# Patient Record
Sex: Male | Born: 1980
Health system: Southern US, Community
[De-identification: ages and names within clinical notes are randomized; demographics above are authoritative.]

## PROBLEM LIST (undated history)

## (undated) DIAGNOSIS — G43909 Migraine, unspecified, not intractable, without status migrainosus: Secondary | ICD-10-CM

---

## 2000-01-26 ENCOUNTER — Emergency Department (HOSPITAL_COMMUNITY): Admission: EM | Admit: 2000-01-26 | Discharge: 2000-01-26 | Payer: Self-pay | Admitting: Emergency Medicine

## 2004-08-09 ENCOUNTER — Emergency Department (HOSPITAL_COMMUNITY): Admission: EM | Admit: 2004-08-09 | Discharge: 2004-08-09 | Payer: Self-pay | Admitting: Emergency Medicine

## 2005-12-18 ENCOUNTER — Emergency Department (HOSPITAL_COMMUNITY): Admission: EM | Admit: 2005-12-18 | Discharge: 2005-12-18 | Payer: Self-pay | Admitting: Emergency Medicine

## 2006-09-18 ENCOUNTER — Emergency Department (HOSPITAL_COMMUNITY): Admission: EM | Admit: 2006-09-18 | Discharge: 2006-09-18 | Payer: Self-pay | Admitting: Emergency Medicine

## 2009-07-24 ENCOUNTER — Emergency Department (HOSPITAL_COMMUNITY): Admission: EM | Admit: 2009-07-24 | Discharge: 2009-07-24 | Payer: Self-pay | Admitting: Emergency Medicine

## 2011-04-04 ENCOUNTER — Emergency Department (HOSPITAL_COMMUNITY)
Admission: EM | Admit: 2011-04-04 | Discharge: 2011-04-05 | Disposition: A | Discharge status: Home / Self Care | Payer: No Typology Code available for payment source | Attending: Emergency Medicine | Admitting: Emergency Medicine

## 2011-04-04 DIAGNOSIS — R109 Unspecified abdominal pain: Secondary | ICD-10-CM | POA: Insufficient documentation

## 2011-04-04 DIAGNOSIS — M545 Low back pain, unspecified: Secondary | ICD-10-CM | POA: Insufficient documentation

## 2011-04-04 DIAGNOSIS — M542 Cervicalgia: Secondary | ICD-10-CM | POA: Insufficient documentation

## 2011-04-05 ENCOUNTER — Emergency Department (HOSPITAL_COMMUNITY): Payer: No Typology Code available for payment source

## 2011-04-05 LAB — CBC
HCT: 42.1 % (ref 39.0–52.0)
MCHC: 35.4 g/dL (ref 30.0–36.0)
MCV: 95.5 fL (ref 78.0–100.0)
RDW: 12.3 % (ref 11.5–15.5)
WBC: 11.6 10*3/uL — ABNORMAL HIGH (ref 4.0–10.5)

## 2011-04-05 LAB — DIFFERENTIAL
Lymphocytes Relative: 39 % (ref 12–46)
Lymphs Abs: 4.6 10*3/uL — ABNORMAL HIGH (ref 0.7–4.0)
Monocytes Absolute: 0.7 10*3/uL (ref 0.1–1.0)
Monocytes Relative: 6 % (ref 3–12)
Neutro Abs: 6.2 10*3/uL (ref 1.7–7.7)
Neutrophils Relative %: 53 % (ref 43–77)

## 2011-04-05 LAB — BASIC METABOLIC PANEL
BUN: 17 mg/dL (ref 6–23)
Chloride: 103 mEq/L (ref 96–112)
GFR calc Af Amer: >60 mL/min (ref >60)
GFR calc non Af Amer: >60 mL/min (ref >60)
Glucose, Bld: 87 mg/dL (ref 70–99)

## 2011-04-05 MED ORDER — IOHEXOL 300 MG/ML  SOLN
125.0000 mL | Freq: Once | INTRAMUSCULAR | Status: AC | PRN
  Administered 2011-04-05: 125 mL via INTRAVENOUS

## 2013-06-29 ENCOUNTER — Emergency Department (HOSPITAL_COMMUNITY): Payer: No Typology Code available for payment source

## 2013-06-29 ENCOUNTER — Emergency Department (HOSPITAL_COMMUNITY)
Admission: EM | Admit: 2013-06-29 | Discharge: 2013-06-29 | Disposition: A | Discharge status: Home / Self Care | Payer: No Typology Code available for payment source | Attending: Emergency Medicine | Admitting: Emergency Medicine

## 2013-06-29 ENCOUNTER — Encounter (HOSPITAL_COMMUNITY): Payer: Self-pay | Admitting: Physical Medicine and Rehabilitation

## 2013-06-29 DIAGNOSIS — R079 Chest pain, unspecified: Secondary | ICD-10-CM | POA: Insufficient documentation

## 2013-06-29 DIAGNOSIS — R197 Diarrhea, unspecified: Secondary | ICD-10-CM | POA: Insufficient documentation

## 2013-06-29 DIAGNOSIS — J3489 Other specified disorders of nose and nasal sinuses: Secondary | ICD-10-CM | POA: Insufficient documentation

## 2013-06-29 DIAGNOSIS — J40 Bronchitis, not specified as acute or chronic: Secondary | ICD-10-CM | POA: Insufficient documentation

## 2013-06-29 DIAGNOSIS — F17200 Nicotine dependence, unspecified, uncomplicated: Secondary | ICD-10-CM | POA: Insufficient documentation

## 2013-06-29 LAB — POCT I-STAT TROPONIN I: Troponin i, poc: 0.01 ng/mL (ref 0.00–0.08)

## 2013-06-29 LAB — BASIC METABOLIC PANEL
CO2: 25 mEq/L (ref 19–32)
Calcium: 9.4 mg/dL (ref 8.4–10.5)
Chloride: 102 mEq/L (ref 96–112)
GFR calc non Af Amer: >90 mL/min (ref >90)
Glucose, Bld: 83 mg/dL (ref 70–99)

## 2013-06-29 LAB — CBC WITH DIFFERENTIAL/PLATELET
Basophils Absolute: 0 10*3/uL (ref 0.0–0.1)
HCT: 41.6 % (ref 39.0–52.0)
Hemoglobin: 14.8 g/dL (ref 13.0–17.0)
Lymphocytes Relative: 35 % (ref 12–46)
MCV: 95.6 fL (ref 78.0–100.0)
Neutrophils Relative %: 51 % (ref 43–77)
Platelets: 209 10*3/uL (ref 150–400)
RDW: 12.5 % (ref 11.5–15.5)

## 2013-06-29 MED ORDER — PREDNISONE 10 MG PO TABS: 20.0000 mg | ORAL_TABLET | Freq: Two times a day (BID) | ORAL | Status: DC

## 2013-06-29 MED ORDER — ALBUTEROL SULFATE HFA 108 (90 BASE) MCG/ACT IN AERS
2.0000 | INHALATION_SPRAY | Freq: Four times a day (QID) | RESPIRATORY_TRACT | Status: DC
  Administered 2013-06-29: 2 via RESPIRATORY_TRACT
  Filled 2013-06-29: qty 6.7

## 2013-06-29 NOTE — ED Notes (Signed)
Pt presents to department for evaluation of SOB and L sided chest pain radiating to R am. Onset Tuesday at work. Pt states chest feels tight, 6/10 pain that becomes worse with movement and deep breathing. Respirations unlabored. Speaking complete sentences. Pt is alert and oriented x4.

## 2013-06-29 NOTE — ED Provider Notes (Signed)
Patient with upper respiratory symptoms including cough and shortness of breath with mild chest pain. This has been going on for several days, on my examination the patient has diffuse mild expiratory wheezing, no rales, speaking in full sentences without any increased work of breathing. There is no peripheral edema, normal heart sounds, soft abdomen. The patient has a clear oropharynx, he appears stable for discharge.  Meds given in ED:  Medications  albuterol (PROVENTIL HFA;VENTOLIN HFA) 108 (90 BASE) MCG/ACT inhaler 2 puff (2 puffs Inhalation Given 06/29/13 1831)    Discharge Medication List as of 06/29/2013  6:28 PM    START taking these medications   Details  predniSONE (DELTASONE) 10 MG tablet Take 2 tablets (20 mg total) by mouth 2 (two) times daily., Starting 06/29/2013, Until Discontinued, Print        Medical screening examination/treatment/procedure(s) were conducted as a shared visit with non-physician practitioner(s) and myself.  I personally evaluated the patient during the encounter.  Clinical Impression: Bronchitis, shortness of breath  Vida Roller, MD 06/29/13 2117

## 2013-06-29 NOTE — ED Provider Notes (Signed)
CSN: [id number]     Arrival date & time 06/29/13  1515 History   First MD Initiated Contact with Patient 06/29/13 1747     Chief Complaint  Patient presents with  . Shortness of Breath  . Chest Pain   (Consider location/radiation/quality/duration/timing/severity/associated sxs/prior Treatment) Patient is a 32 y.o. male presenting with shortness of breath and chest pain. The history is provided by the patient. No language interpreter was used.  Shortness of Breath Severity:  Mild Onset quality:  Gradual Timing:  Constant Chronicity:  New Ineffective treatments: tried Tylenol cold and Flu with minimal relief. Associated symptoms: chest pain and cough   Chest pain:    Quality:  Aching   Severity:  Mild   Onset quality:  Gradual   Duration:  1 day   Timing:  Intermittent   Progression:  Unchanged   Chronicity:  New Cough:    Cough characteristics:  Non-productive   Severity:  Mild   Duration:  2 days   Timing:  Intermittent   Progression:  Worsening   Chronicity:  New Risk factors: no family hx of DVT, no hx of PE/DVT and no obesity   Chest Pain Associated symptoms: cough and shortness of breath    Pt is a 32 year old male who presents with c/o shortness of breath and chest pain. He reports that he has recently been sick with diarrhea and nausea approx 4 days ago with associated fever. He reports an increase in shortness of breath and chest congestion since 11am this morning. He reports that it hurts on the left side of his chest and it is difficult to get a good deep breath. He has accompanied nasal congestion and a sore throat when he swallows. He has had a non-productive cough for the last 3-4 days that seems to worsen at night time. He has tried Tylenol cold and flu with minimal reflief.    History reviewed. No pertinent past medical history. History reviewed. No pertinent past surgical history. History reviewed. No pertinent family history. History  Substance Use Topics   . Smoking status: Current Every Day Smoker    Types: Cigarettes  . Smokeless tobacco: Not on file  . Alcohol Use: No    Review of Systems  Constitutional:       Possible fever last Saturday  HENT: Positive for congestion.   Respiratory: Positive for cough and shortness of breath.   Cardiovascular: Positive for chest pain.  Gastrointestinal: Positive for diarrhea.       Friday and Saturday reports of diarrhea  All other systems reviewed and are negative.    Allergies  Review of patient's allergies indicates no known allergies.  Home Medications  No current outpatient prescriptions on file. BP 133/79  Pulse 58  Temp(Src) 97.9 F (36.6 C) (Oral)  Resp 20  SpO2 96% Physical Exam  Vitals reviewed. Constitutional: He is oriented to person, place, and time. He appears well-developed and well-nourished. No distress.  HENT:  Head: Normocephalic and atraumatic.  Right Ear: Tympanic membrane, external ear and ear canal normal.  Left Ear: Tympanic membrane, external ear and ear canal normal.  Nose: Sinus tenderness present.  Mouth/Throat: Posterior oropharyngeal erythema present. No oropharyngeal exudate.  Tonsils grade II, mild erythema. Nasal tenderness bilaterally.  Eyes: Pupils are equal, round, and reactive to light.  Neck: Normal range of motion.  Pulmonary/Chest: Effort normal. He has wheezes.  Mild inspiratory wheeze, RMF.  Abdominal: Soft. Bowel sounds are normal.  Musculoskeletal: Normal range of motion.  Lymphadenopathy:    He has no cervical adenopathy.  Neurological: He is alert and oriented to person, place, and time.  Skin: Skin is warm and dry.  Psychiatric: He has a normal mood and affect. His behavior is normal. Judgment and thought content normal.    ED Course  Procedures (including critical care time) Labs Review Labs Reviewed  CBC WITH DIFFERENTIAL - Abnormal; Notable for the following:    Eosinophils Relative 7 (*)    All other components within  normal limits  BASIC METABOLIC PANEL  POCT I-STAT TROPONIN I   Imaging Review Dg Chest 2 View  06/29/2013   CLINICAL DATA:  32 year old male with shortness of breath and left side chest pain.  EXAM: CHEST  2 VIEW  COMPARISON:  07/24/2009.  FINDINGS: Lung volumes are stable and normal. Visualized tracheal air column is within normal limits. No pleural effusion. No acute osseous abnormality identified. Normal cardiac size and mediastinal contours. No pulmonary edema, pneumothorax or confluent pulmonary opacity.  IMPRESSION: No acute cardiopulmonary abnormality.   Electronically Signed   By: Augusto Gamble M.D.   On: 06/29/2013 16:04    MDM   1. Bronchitis    Pt afebrile, vital signs stable. No dyspnea or tachycardia. History of viral illness with nausea and diarrhea 3-4 days ago. Non-productive cough and congestion with gradual increase in shortness of breath. Chest x-ray unremarkable, troponin negative, WBC's normal. No history of cardiac disease, DVT or PE. Mild wheezing cleared after albuterol MDI.      Irish Elders, NP 06/29/13 [phone]

## 2015-02-20 ENCOUNTER — Ambulatory Visit (INDEPENDENT_AMBULATORY_CARE_PROVIDER_SITE_OTHER): Payer: BLUE CROSS/BLUE SHIELD | Admitting: Family Medicine

## 2015-02-20 VITALS — BP 118/76 | HR 80 | Temp 97.8°F | Resp 16 | Ht 65.0 in | Wt 179.8 lb

## 2015-02-20 DIAGNOSIS — M79601 Pain in right arm: Secondary | ICD-10-CM | POA: Diagnosis not present

## 2015-02-20 DIAGNOSIS — L02411 Cutaneous abscess of right axilla: Secondary | ICD-10-CM | POA: Diagnosis not present

## 2015-02-20 DIAGNOSIS — M79621 Pain in right upper arm: Secondary | ICD-10-CM

## 2015-02-20 MED ORDER — DOXYCYCLINE HYCLATE 100 MG PO CAPS: 100.0000 mg | ORAL_CAPSULE | Freq: Two times a day (BID) | ORAL | Status: DC

## 2015-02-20 NOTE — Patient Instructions (Signed)
Use warm compresses on the area on your right axilla.   Keep it covered today- tomorrow you can shower and then re- cover the area Use the antibiotic twice a day as directed Let me know if you are not improving over the next few days Also let me know if you would like to see a dermatologist about this condition  Hidradenitis Suppurativa, Sweat Gland Abscess Hidradenitis suppurativa is a long lasting (chronic), uncommon disease of the sweat glands. With this, boil-like lumps and scarring develop in the groin, some times under the arms (axillae), and under the breasts. It may also uncommonly occur behind the ears, in the crease of the buttocks, and around the genitals.  CAUSES  The cause is from a blocking of the sweat glands. They then become infected. It may cause drainage and odor. It is not contagious. So it cannot be given to someone else. It most often shows up in puberty (about 810 to 34 years of age). But it may happen much later. It is similar to acne which is a disease of the sweat glands. This condition is slightly more common in African-Americans and women. SYMPTOMS   Hidradenitis usually starts as one or more red, tender, swellings in the groin or under the arms (axilla).  Over a period of hours to days the lesions get larger. They often open to the skin surface, draining clear to yellow-colored fluid.  The infected area heals with scarring. DIAGNOSIS  Your caregiver makes this diagnosis by looking at you. Sometimes cultures (growing germs on plates in the lab) may be taken. This is to see what germ (bacterium) is causing the infection.  TREATMENT   Topical germ killing medicine applied to the skin (antibiotics) are the treatment of choice. Antibiotics taken by mouth (systemic) are sometimes needed when the condition is getting worse or is severe.  Avoid tight-fitting clothing which traps moisture in.  Dirt does not cause hidradenitis and it is not caused by poor  hygiene.  Involved areas should be cleaned daily using an antibacterial soap. Some patients find that the liquid form of Lever 2000, applied to the involved areas as a lotion after bathing, can help reduce the odor related to this condition.  Sometimes surgery is needed to drain infected areas or remove scarred tissue. Removal of large amounts of tissue is used only in severe cases.  Birth control pills may be helpful.  Oral retinoids (vitamin A derivatives) for 6 to 12 months which are effective for acne may also help this condition.  Weight loss will improve but not cure hidradenitis. It is made worse by being overweight. But the condition is not caused by being overweight.  This condition is more common in people who have had acne.  It may become worse under stress. There is no medical cure for hidradenitis. It can be controlled, but not cured. The condition usually continues for years with periods of getting worse and getting better (remission). Document Released: 05/06/2004 Document Revised: 12/15/2011 Document Reviewed: 12/23/2013 [facility] Patient Information 2015 [employer] [place]. This information is not intended to replace advice given to you by your health care provider. Make sure you discuss any questions you have with your health care provider.

## 2015-02-20 NOTE — Progress Notes (Signed)
[facility] [address], [place] [address] [phone]  Date:  02/20/2015   Name:  Donald BalesRobert Brown   DOB:  05/09/1981   MRN:  [id number]  PCP:  No PCP Per Patient    Chief Complaint: Flank Pain and other   History of Present Illness:  Donald Brown is a 34 y.o. very pleasant male patient who presents with the following:  He has noted a boil under ihs right arm for a couple of days.  He tends to get these under his arms and in his gluteal cleft- he may get these 2-3x a month, but does not usually need to come to MD to have them drained.  He notes some pain in his right flank but thinks this is related to the boil. He has not noted a fever He is generally in good health He is a smoker NKDA  There are no active problems to display for this patient.   History reviewed. No pertinent past medical history.  History reviewed. No pertinent past surgical history.  History  Substance Use Topics  . Smoking status: Current Every Day Smoker    Types: Cigarettes  . Smokeless tobacco: Not on file  . Alcohol Use: No    Family History  Problem Relation Age of Onset  . Diabetes Mother   . Hypertension Mother     No Known Allergies  Medication list has been reviewed and updated.  Current Outpatient Prescriptions on File Prior to Visit  Medication Sig Dispense Refill  . predniSONE (DELTASONE) 10 MG tablet Take 2 tablets (20 mg total) by mouth 2 (two) times daily. (Patient not taking: Reported on 02/20/2015) 10 tablet 0   No current facility-administered medications on file prior to visit.    Review of Systems:  As per HPI- otherwise negative.   Physical Examination: Filed Vitals:   02/20/15 1435  BP: 118/76  Pulse: 80  Temp: 97.8 F (36.6 C)  Resp: 16   Filed Vitals:   02/20/15 1435  Height: 5\' 5"  (1.651 m)  Weight: 179 lb 12.8 oz (81.557 kg)   Body mass index is 29.92 kg/(m^2). Ideal Body Weight: Weight in (lb) to have BMI = 25: 149.9  GEN:  WDWN, NAD, Non-toxic, A & O x 3, looks well, muscular build but he does not appear to be overweight HEENT: Atraumatic, Normocephalic. Neck supple. No masses, No LAD. Ears and Nose: No external deformity. CV: RRR, No M/G/R. No JVD. No thrill. No extra heart sounds. PULM: CTA B, no wheezes, crackles, rhonchi. No retractions. No resp. distress. No accessory muscle use. ABD: S, NT, ND EXTR: No c/c/e NEURO Normal gait.  PSYCH: Normally interactive. Conversant. Not depressed or anxious appearing.  Calm demeanor.  He has a boil in the right axilla, somewhat inferior in position VC obtained.  Area prepped with betadine and alcohol.  Anesthesia with 1ml of 1% lido.  I and D with 11 blade, being careful not to penetrate further than dermis of the chest.  Pus expressed.   Wound dressed   He notes that flank pain is resolved once I and D complete  Assessment and Plan: Abscess of right axilla - Plan: doxycycline (VIBRAMYCIN) 100 MG capsule  Pain in right axilla  Treated with I and D as above. Doxycycline, warm compresses Discussed referral for his likely hidradenitis but he declines at this time See patient instructions for more details.     Signed Abbe AmsterdamJessica Copland, MD

## 2015-10-04 ENCOUNTER — Encounter (HOSPITAL_COMMUNITY): Payer: Self-pay | Admitting: Emergency Medicine

## 2015-10-04 ENCOUNTER — Emergency Department (HOSPITAL_COMMUNITY)
Admission: EM | Admit: 2015-10-04 | Discharge: 2015-10-04 | Disposition: A | Discharge status: Home / Self Care | Payer: BLUE CROSS/BLUE SHIELD | Source: Home / Self Care

## 2015-10-04 DIAGNOSIS — J111 Influenza due to unidentified influenza virus with other respiratory manifestations: Secondary | ICD-10-CM

## 2015-10-04 NOTE — ED Notes (Signed)
[facility] a [facility], fever and general body aches x 2 days. The patient stated that he has tried OTC meds with minimal relief.

## 2015-10-04 NOTE — ED Provider Notes (Signed)
CSN: [id number]     Arrival date & time 10/04/15  1811 History   None    Chief Complaint  Patient presents with  . Fever  . Cough  . Generalized Body Aches   (Consider location/radiation/quality/duration/timing/severity/associated sxs/prior Treatment) HPI History obtained from patient:  Fever body aches, cough headache started yesterday. Tylenol for fever at home Ibuprofen 400 mg at 1 pm Concerned that he may have pneumonia History reviewed. No pertinent past medical history. History reviewed. No pertinent past surgical history. Family History  Problem Relation Age of Onset  . Diabetes Mother   . Hypertension Mother    Social History  Substance Use Topics  . Smoking status: Current Every Day Smoker    Types: Cigarettes  . Smokeless tobacco: None  . Alcohol Use: No    Review of Systems Shatz +'ve body aches  Denies: HEADACHE, NAUSEA, ABDOMINAL PAIN, CHEST PAIN, CONGESTION, DYSURIA, SHORTNESS OF BREATH  Allergies  Review of patient's allergies indicates no known allergies.  Home Medications   Prior to Admission medications   Medication Sig Start Date End Date Taking? Authorizing Provider  doxycycline (VIBRAMYCIN) 100 MG capsule Take 1 capsule (100 mg total) by mouth 2 (two) times daily. 02/20/15   Pearline CablesJessica C Copland, MD   Meds Ordered and Administered this Visit  Medications - No data to display  BP 120/77 mmHg  Pulse 112  Temp(Src) 99.7 F (37.6 C) (Oral)  Resp 20  SpO2 95% No data found.   Physical Exam NURSES NOTES AND VITAL SIGNS REVIEWED. CONSTITUTIONAL: Well developed, well nourished, no acute distress HEENT: normocephalic, atraumatic EYES: Conjunctiva normal NECK:normal ROM, supple PULMONARY:No respiratory distress, normal effort, Lungs: CTAb/l CARDIOVASCULAR: RRR, no murmur ABDOMEN: soft, ND, NT, +'ve BS MUSCULOSKELETAL: Normal ROM of all extremities SKIN: warm and dry without rash PSYCHIATRIC: Mood and affect normal  ED Course  Procedures  (including critical care time)  Labs Review Labs Reviewed - No data to display  Imaging Review No results found.   Visual Acuity Review  Right Eye Distance:   Left Eye Distance:   Bilateral Distance:    Right Eye Near:   Left Eye Near:    Bilateral Near:         MDM   1. Flu syndrome    Discussed use of Tamiflu. Pt feels that he does not wish to have this medication. Will continue to treat symptoms.     Tharon AquasFrank C Naseem Varden, PA 10/04/15 2003

## 2015-10-04 NOTE — Discharge Instructions (Signed)

## 2017-06-02 ENCOUNTER — Emergency Department (HOSPITAL_COMMUNITY)
Admission: EM | Admit: 2017-06-02 | Discharge: 2017-06-02 | Disposition: A | Discharge status: Home / Self Care | Payer: BLUE CROSS/BLUE SHIELD | Attending: Emergency Medicine | Admitting: Emergency Medicine

## 2017-06-02 ENCOUNTER — Emergency Department (HOSPITAL_COMMUNITY): Payer: BLUE CROSS/BLUE SHIELD

## 2017-06-02 ENCOUNTER — Encounter (HOSPITAL_COMMUNITY): Payer: Self-pay | Admitting: Emergency Medicine

## 2017-06-02 DIAGNOSIS — F17210 Nicotine dependence, cigarettes, uncomplicated: Secondary | ICD-10-CM | POA: Insufficient documentation

## 2017-06-02 DIAGNOSIS — G43909 Migraine, unspecified, not intractable, without status migrainosus: Secondary | ICD-10-CM

## 2017-06-02 DIAGNOSIS — R51 Headache: Secondary | ICD-10-CM | POA: Diagnosis not present

## 2017-06-02 DIAGNOSIS — G43009 Migraine without aura, not intractable, without status migrainosus: Secondary | ICD-10-CM | POA: Diagnosis not present

## 2017-06-02 HISTORY — DX: Migraine, unspecified, not intractable, without status migrainosus: G43.909

## 2017-06-02 MED ORDER — METOCLOPRAMIDE HCL 10 MG PO TABS
5.0000 mg | ORAL_TABLET | Freq: Once | ORAL | Status: AC
  Administered 2017-06-02: 5 mg via ORAL
  Filled 2017-06-02: qty 1

## 2017-06-02 MED ORDER — KETOROLAC TROMETHAMINE 30 MG/ML IJ SOLN
30.0000 mg | Freq: Once | INTRAMUSCULAR | Status: AC
  Administered 2017-06-02: 30 mg via INTRAMUSCULAR
  Filled 2017-06-02: qty 1

## 2017-06-02 MED ORDER — DIPHENHYDRAMINE HCL 25 MG PO CAPS
25.0000 mg | ORAL_CAPSULE | Freq: Once | ORAL | Status: AC
  Administered 2017-06-02: 25 mg via ORAL
  Filled 2017-06-02: qty 1

## 2017-06-02 NOTE — ED Notes (Signed)
No hx migraines, pt is photophobic, nauseated.

## 2017-06-02 NOTE — ED Provider Notes (Signed)
[facility]PT Provider Note   CSN: [id number] Arrival date & time: 06/02/17  1537     History   Chief Complaint Chief Complaint  Patient presents with  . Migraine    HPI Donald Brown is a 36 y.o. male.  HPI   36 year old male presents today with complaints of headache.  Patient notes that over the last several months he has had intermittent head pressure diffusely.  He notes the symptoms come without any known aggravating factor.  The last anywhere from hours to days and are improved with rest, sometimes improved with Tylenol.  He notes no associated dizziness, nausea or vomiting.  He reports photophobia, no phonophobia.  Patient reports that when the symptoms start he often times has numbness down his right upper extremity which resolves with resolution of headache.  Patient denies any other neurological deficits.  Patient reports most recent episode started approximately 1 day ago and has persisted, no neuro deficits with this episode.  He denies any previous history of headache prior to these episodes.  Patient does report that he wears glasses.     Past Medical History:  Diagnosis Date  . Migraines     There are no active problems to display for this patient.   History reviewed. No pertinent surgical history.     Home Medications    Prior to Admission medications   Medication Sig Start Date End Date Taking? Authorizing Provider  doxycycline (VIBRAMYCIN) 100 MG capsule Take 1 capsule (100 mg total) by mouth 2 (two) times daily. 02/20/15   Copland, Gwenlyn Found, MD    Family History Family History  Problem Relation Age of Onset  . Diabetes Mother   . Hypertension Mother     Social History Social History  Substance Use Topics  . Smoking status: Current Every Day Smoker    Types: Cigarettes  . Smokeless tobacco: Never Used  . Alcohol use No     Allergies   Patient has no known allergies.   Review of Systems Review of Systems  All other systems  reviewed and are negative.    Physical Exam Updated Vital Signs BP 130/82   Pulse 62   Temp 98.4 F (36.9 C) (Oral)   Resp 16   Ht 5\' 6"  (1.676 m)   Wt 81.6 kg (180 lb)   SpO2 99%   BMI 29.05 kg/m   Physical Exam  Constitutional: He is oriented to person, place, and time. He appears well-developed and well-nourished. No distress.  HENT:  Head: Normocephalic and atraumatic.  Eyes: Pupils are equal, round, and reactive to light. Conjunctivae and EOM are normal. Right eye exhibits no discharge. Left eye exhibits no discharge. No scleral icterus.  Neck: Normal range of motion. Neck supple. No JVD present. No tracheal deviation present.  Pulmonary/Chest: Effort normal. No stridor.  Musculoskeletal: Normal range of motion. He exhibits no edema or tenderness.  Lymphadenopathy:    He has no cervical adenopathy.  Neurological: He is alert and oriented to person, place, and time. He has normal strength. He displays no atrophy and no tremor. No cranial nerve deficit or sensory deficit. He exhibits normal muscle tone. He displays a negative Romberg sign. He displays no seizure activity. Coordination and gait normal. GCS eye subscore is 4. GCS verbal subscore is 5. GCS motor subscore is 6.  Reflex Scores:      Patellar reflexes are 2+ on the right side and 2+ on the left side. Skin: He is not diaphoretic.  Psychiatric: He  has a normal mood and affect. His behavior is normal. Judgment and thought content normal.  Nursing note and vitals reviewed.    ED Treatments / Results  Labs (all labs ordered are listed, but only abnormal results are displayed) Labs Reviewed - No data to display  EKG  EKG Interpretation None       Radiology Ct Head Wo Contrast  Result Date: 06/02/2017 CLINICAL DATA:  Headache with photophobia and blurred vision EXAM: CT HEAD WITHOUT CONTRAST TECHNIQUE: Contiguous axial images were obtained from the base of the skull through the vertex without intravenous  contrast. COMPARISON:  None. FINDINGS: Brain: No evidence of acute infarction, hemorrhage, hydrocephalus, extra-axial collection or mass lesion/mass effect. Vascular: No hyperdense vessel or unexpected calcification. Skull: Normal. Negative for fracture or focal lesion. Sinuses/Orbits: Mild mucosal thickening in the ethmoid and sphenoid sinuses. No acute orbital abnormality. Other: None IMPRESSION: No CT evidence for acute intracranial abnormality. Electronically Signed   By: Jasmine Pang M.D.   On: 06/02/2017 21:07    Procedures Procedures (including critical care time)  Medications Ordered in ED Medications  ketorolac (TORADOL) 30 MG/ML injection 30 mg (30 mg Intramuscular Given 06/02/17 2016)  metoCLOPramide (REGLAN) tablet 5 mg (5 mg Oral Given 06/02/17 2015)  diphenhydrAMINE (BENADRYL) capsule 25 mg (25 mg Oral Given 06/02/17 2015)     Initial Impression / Assessment and Plan / ED Course  I have reviewed the triage vital signs and the nursing notes.  Pertinent labs & imaging results that were available during my care of the patient were reviewed by me and considered in my medical decision making (see chart for details).      Final Clinical Impressions(s) / ED Diagnoses   Final diagnoses:  Migraine without status migrainosus, not intractable, unspecified migraine type    36 year old male presents today with migraine.  Symptoms improved with the above medications.  CT scan shows no acute findings.  Patient will follow-up as an outpatient with neurology if symptoms persist, return precautions given.  He verbalized understanding and agreement to today's plan.  New Prescriptions New Prescriptions   No medications on file     Rosalio Loud 06/02/17 2133    Pricilla Loveless, MD 06/03/17 [phone]

## 2017-06-02 NOTE — Discharge Instructions (Signed)
Please read attached information. If you experience any new or worsening signs or symptoms please return to the emergency room for evaluation. Please follow-up with your primary care provider or specialist as discussed.  °

## 2017-06-02 NOTE — ED Triage Notes (Signed)
Pt states he gets  2-3 migraines a month; sometimes one a week. Experiencing a migraine today, took 2 tylenol with some relief. Pt states light bothers his eyes. Pt states he has been nauseated. Neuro intact.

## 2017-06-02 NOTE — ED Notes (Signed)
Pt. Complains of a migraine that started last night. Has taken tylenol and it helped a little. Can cause right arm numbness and blurry vision. Pt. Reports nausea but denies emesis.

## 2017-07-09 DIAGNOSIS — R61 Generalized hyperhidrosis: Secondary | ICD-10-CM | POA: Diagnosis not present

## 2017-07-09 DIAGNOSIS — M79641 Pain in right hand: Secondary | ICD-10-CM | POA: Diagnosis not present

## 2017-07-09 DIAGNOSIS — R718 Other abnormality of red blood cells: Secondary | ICD-10-CM | POA: Diagnosis not present

## 2017-07-09 DIAGNOSIS — Z23 Encounter for immunization: Secondary | ICD-10-CM | POA: Diagnosis not present

## 2017-07-09 DIAGNOSIS — Z114 Encounter for screening for human immunodeficiency virus [HIV]: Secondary | ICD-10-CM | POA: Diagnosis not present

## 2017-07-09 DIAGNOSIS — Z0000 Encounter for general adult medical examination without abnormal findings: Secondary | ICD-10-CM | POA: Diagnosis not present

## 2017-07-09 DIAGNOSIS — M79642 Pain in left hand: Secondary | ICD-10-CM | POA: Diagnosis not present

## 2017-07-09 DIAGNOSIS — Z13220 Encounter for screening for lipoid disorders: Secondary | ICD-10-CM | POA: Diagnosis not present

## 2018-08-23 DIAGNOSIS — M7521 Bicipital tendinitis, right shoulder: Secondary | ICD-10-CM | POA: Diagnosis not present

## 2018-09-08 DIAGNOSIS — M25511 Pain in right shoulder: Secondary | ICD-10-CM | POA: Diagnosis not present

## 2019-04-06 ENCOUNTER — Other Ambulatory Visit: Payer: Self-pay

## 2019-04-06 ENCOUNTER — Encounter (HOSPITAL_COMMUNITY): Payer: Self-pay | Admitting: Emergency Medicine

## 2019-04-06 ENCOUNTER — Ambulatory Visit (INDEPENDENT_AMBULATORY_CARE_PROVIDER_SITE_OTHER): Payer: BC Managed Care – PPO

## 2019-04-06 ENCOUNTER — Ambulatory Visit (HOSPITAL_COMMUNITY)
Admission: EM | Admit: 2019-04-06 | Discharge: 2019-04-06 | Disposition: A | Discharge status: Home / Self Care | Payer: BC Managed Care – PPO | Attending: Urgent Care | Admitting: Urgent Care

## 2019-04-06 DIAGNOSIS — J410 Simple chronic bronchitis: Secondary | ICD-10-CM | POA: Diagnosis not present

## 2019-04-06 DIAGNOSIS — Z720 Tobacco use: Secondary | ICD-10-CM

## 2019-04-06 DIAGNOSIS — R079 Chest pain, unspecified: Secondary | ICD-10-CM | POA: Diagnosis not present

## 2019-04-06 DIAGNOSIS — R05 Cough: Secondary | ICD-10-CM

## 2019-04-06 DIAGNOSIS — R042 Hemoptysis: Secondary | ICD-10-CM

## 2019-04-06 DIAGNOSIS — R42 Dizziness and giddiness: Secondary | ICD-10-CM

## 2019-04-06 DIAGNOSIS — K644 Residual hemorrhoidal skin tags: Secondary | ICD-10-CM

## 2019-04-06 DIAGNOSIS — R519 Headache, unspecified: Secondary | ICD-10-CM

## 2019-04-06 DIAGNOSIS — R053 Chronic cough: Secondary | ICD-10-CM

## 2019-04-06 DIAGNOSIS — R51 Headache: Secondary | ICD-10-CM | POA: Diagnosis not present

## 2019-04-06 DIAGNOSIS — M2550 Pain in unspecified joint: Secondary | ICD-10-CM

## 2019-04-06 MED ORDER — AZITHROMYCIN 250 MG PO TABS: ORAL_TABLET | ORAL | 0 refills | Status: DC

## 2019-04-06 MED ORDER — HYDROCORTISONE 1 % EX CREA: TOPICAL_CREAM | CUTANEOUS | 0 refills | Status: DC

## 2019-04-06 MED ORDER — ALBUTEROL SULFATE HFA 108 (90 BASE) MCG/ACT IN AERS: 1.0000 | INHALATION_SPRAY | Freq: Four times a day (QID) | RESPIRATORY_TRACT | 0 refills | Status: DC | PRN

## 2019-04-06 NOTE — ED Triage Notes (Signed)
PT reports the following symptoms have been intermittent for the past 2 years: cough, headache, dizziness, rectal bleeding, back pain, R shoulder pain, R leg numbness.  Only new symptom is PT noticed some blood in sputum this AM

## 2019-04-06 NOTE — ED Provider Notes (Signed)
MRN: [id number] DOB: 06-26-1981  Subjective:   Donald Brown is a 38 y.o. male presenting for 2-year history of chronic productive cough and had blood in his sputum this morning that concerned him. Smokes ~1ppd, has ~20 pack year history. Has a history of migraines and uses Tylenol successfully for management of this.  Patient also reports multiple intermittent symptoms including dizziness, mild intermittent low back pain shoulder pain, tingling of his legs.  He also reports that he has blood in his stools and when he wipes.  Reports that he eats a mix of foods and tries to get fiber.  He also does a lot of heavy lifting at work.   No Known Allergies   Past Medical History:  Diagnosis Date  . Migraines      History reviewed. No pertinent surgical history.   Review of Systems  Constitutional: Negative for chills, fever and weight loss.  HENT: Negative for ear pain, sinus pain and sore throat.   Eyes: Negative for blurred vision and double vision.  Respiratory: Positive for cough, hemoptysis and shortness of breath.   Cardiovascular: Negative for chest pain and palpitations.  Gastrointestinal: Positive for blood in stool. Negative for abdominal pain, nausea and vomiting.  Genitourinary: Negative for dysuria and hematuria.  Musculoskeletal: Positive for back pain, joint pain and myalgias.  Skin: Negative for rash.  Neurological: Negative for speech change and weakness.  Psychiatric/Behavioral: Negative for substance abuse.    Objective:   Vitals: BP 139/84   Pulse 64   Temp 98.5 F (36.9 C) (Oral)   Resp 16   SpO2 99%   Physical Exam Constitutional:      General: He is not in acute distress.    Appearance: Normal appearance. He is well-developed. He is not ill-appearing, toxic-appearing or diaphoretic.  HENT:     Head: Normocephalic and atraumatic.     Right Ear: External ear normal.     Left Ear: External ear normal.     Nose: Nose normal.     Mouth/Throat:     Mouth:  Mucous membranes are moist.     Pharynx: Oropharynx is clear.  Eyes:     General: No scleral icterus.    Extraocular Movements: Extraocular movements intact.     Pupils: Pupils are equal, round, and reactive to light.  Cardiovascular:     Rate and Rhythm: Normal rate and regular rhythm.     Heart sounds: Normal heart sounds. No murmur. No friction rub. No gallop.   Pulmonary:     Effort: Pulmonary effort is normal. No respiratory distress.     Breath sounds: No stridor. No wheezing, rhonchi or rales.     Comments: Slightly diminished breath sounds throughout. Genitourinary:    Rectum: External hemorrhoid (Notable nonthrombosed external hemorrhoids over areas depicted, all approximately 1 cm in size) present.    Neurological:     Mental Status: He is alert and oriented to person, place, and time.  Psychiatric:        Mood and Affect: Mood normal.        Behavior: Behavior normal.        Thought Content: Thought content normal.    Dg Chest 2 View  Result Date: 04/06/2019 CLINICAL DATA:  Chronic cough. Apophysis. Chest pain, shortness of breath, and wheezing. Smoker. EXAM: CHEST - 2 VIEW COMPARISON:  06/29/2013 FINDINGS: The cardiomediastinal silhouette is unchanged with normal heart size. No airspace consolidation, edema, pleural effusion, or pneumothorax is identified. There is mild peribronchial thickening. No  acute osseous abnormality is seen. IMPRESSION: Mild bronchitic changes. Electronically Signed   By: Sebastian AcheAllen  Grady M.D.   On: 04/06/2019 11:35    Assessment and Plan :   1. Simple chronic bronchitis (HCC)   2. Chronic cough   3. Hemoptysis   4. Frequent headaches   5. External hemorrhoids   6. Multiple joint pain   7. Dizziness   8. Tobacco abuse    We will have patient start azithromycin to address acute on chronic bronchitis.  Use albuterol inhaler, recommended smoking cessation.  Use hydrocortisone rectal for hemorrhoids.  Patient is otherwise stable, placed into a work  you for PCP assistance. Counseled patient on potential for adverse effects with medications prescribed/recommended today, ER and return-to-clinic precautions discussed, patient verbalized understanding.    Wallis BambergMani, Lillith Mcneff, [place] JerseyPA-C 04/06/19 [id number]

## 2019-06-24 DIAGNOSIS — Z20828 Contact with and (suspected) exposure to other viral communicable diseases: Secondary | ICD-10-CM | POA: Diagnosis not present

## 2019-08-03 ENCOUNTER — Ambulatory Visit (HOSPITAL_COMMUNITY)
Admission: EM | Admit: 2019-08-03 | Discharge: 2019-08-03 | Disposition: A | Discharge status: Home / Self Care | Payer: BC Managed Care – PPO | Attending: Family Medicine | Admitting: Family Medicine

## 2019-08-03 ENCOUNTER — Other Ambulatory Visit: Payer: Self-pay

## 2019-08-03 ENCOUNTER — Encounter (HOSPITAL_COMMUNITY): Payer: Self-pay

## 2019-08-03 DIAGNOSIS — N451 Epididymitis: Secondary | ICD-10-CM

## 2019-08-03 MED ORDER — LEVOFLOXACIN 500 MG PO TABS: 500.0000 mg | ORAL_TABLET | Freq: Every day | ORAL | 0 refills | Status: DC

## 2019-08-03 NOTE — Discharge Instructions (Signed)
You may use over the counter ibuprofen or acetaminophen as needed.  ° °

## 2019-08-03 NOTE — ED Provider Notes (Signed)
[facility]   [id number] 08/03/19 Arrival Time: 7846  ASSESSMENT & PLAN:  1. Epididymitis, right     Benign abdominal exam. Given duration of symptoms and with current exam, very low suspicion for testicular torsion. Discussed. Declines STI testing at this time.  Begin: Meds ordered this encounter  Medications  . levofloxacin (LEVAQUIN) 500 MG tablet    Sig: Take 1 tablet (500 mg total) by mouth daily.    Dispense:  10 tablet    Refill:  0     Discharge Instructions     You may use over the counter ibuprofen or acetaminophen as needed.     Follow-up Information    [facility].   Why: If worsening or failing to improve as anticipated. Contact information: [address] [place] [address] [place] [address] [phone]       [facility].   Specialty: Urgent Care Why: As needed. Contact information: [address] [place] [address] [phone]         Work note provided.  Reviewed expectations re: course of current medical issues. Questions answered. Outlined signs and symptoms indicating need for more acute intervention. Patient verbalized understanding. After Visit Summary given.   SUBJECTIVE: History from: patient. Donald Brown is a 38 y.o. male who presents with complaint of intermittent lower abdominal discomfort that radiates to testicle; noticed about 1.5-2 weeks ago; very mild at onset; now much more noticeable. More in testicle now. Describes as "an ache". No scrotal swelling noted. No injury/trauma to scrotum. Last sexual activity with single male partner greater than one month ago. Denies dysuria or urinary frequency. No penile discharge. No GU skin changes. Pain worse with prolonged standing. Fever: absent. He denies arthralgias, belching, chills, constipation, diarrhea, myalgias, nausea, sweats and vomiting. Appetite: normal. PO intake: normal. Ambulatory without  assistance. Bowel movements: have not significantly changed; last bowel movement 1-2 d ago and without blood. History of similar: no. OTC treatment: Tylenol with some relief.   History reviewed. No pertinent surgical history.  [place]amp: As per HPI. All other systems negative.  OBJECTIVE:  Vitals:   08/03/19 1001 08/03/19 1003  BP:  136/79  Pulse:  86  Resp:  (!) 86  Temp:  97.8 F (36.6 C)  TempSrc:  Oral  SpO2:  99%  Weight: 78.5 kg     General appearance: alert, oriented, no acute distress HEENT: [place]; AT; oropharynx moist Lungs: clear to auscultation bilaterally; unlabored respirations Heart: regular rate and rhythm Abdomen: soft; without distention; no specific tenderness to palpation; normal bowel sounds; without masses or organomegaly; without guarding or rebound tenderness GU: normal appearing genitalia; no rashes/lesions; reports significant tenderness over R epididymis Back: without CVA tenderness; FROM at waist Extremities: without LE edema; symmetrical; without gross deformities Skin: warm and dry Neurologic: normal gait Psychological: alert and cooperative; normal mood and affect   No Known Allergies                                             Past Medical History:  Diagnosis Date  . Migraines    Social History   Socioeconomic History  . Marital status: Married    Spouse name: Not on file  . Number of children: Not on file  . Years of education: Not on file  . Highest education level: Not on  file  Occupational History  . Not on file  Social Needs  . Financial resource strain: Not on file  . Food insecurity    Worry: Not on file    Inability: Not on file  . Transportation needs    Medical: Not on file    Non-medical: Not on file  Tobacco Use  . Smoking status: Current Every Day Smoker    Types: Cigarettes  . Smokeless tobacco: Never Used  Substance and Sexual Activity  . Alcohol use: No  . Drug use: No  . Sexual activity: Not on file  Lifestyle   . Physical activity    Days per week: Not on file    Minutes per session: Not on file  . Stress: Not on file  Relationships  . Social Herbalist on phone: Not on file    Gets together: Not on file    Attends religious service: Not on file    Active member of club or organization: Not on file    Attends meetings of clubs or organizations: Not on file    Relationship status: Not on file  . Intimate partner violence    Fear of current or ex partner: Not on file    Emotionally abused: Not on file    Physically abused: Not on file    Forced sexual activity: Not on file  Other Topics Concern  . Not on file  Social History Narrative  . Not on file   Family History  Problem Relation Age of Onset  . Diabetes Mother   . Hypertension Mother      Vanessa Kick, MD 08/03/19 1025

## 2019-08-03 NOTE — ED Triage Notes (Signed)
Pt states he has had abdominal pain and it's radiating down to his testicles. This has been going on for 2 week.

## 2019-08-16 ENCOUNTER — Emergency Department (HOSPITAL_COMMUNITY): Payer: BC Managed Care – PPO

## 2019-08-16 ENCOUNTER — Other Ambulatory Visit: Payer: Self-pay

## 2019-08-16 ENCOUNTER — Encounter (HOSPITAL_COMMUNITY): Payer: Self-pay

## 2019-08-16 ENCOUNTER — Emergency Department (HOSPITAL_COMMUNITY)
Admission: EM | Admit: 2019-08-16 | Discharge: 2019-08-16 | Disposition: A | Discharge status: Home / Self Care | Payer: BC Managed Care – PPO | Attending: Emergency Medicine | Admitting: Emergency Medicine

## 2019-08-16 DIAGNOSIS — F17210 Nicotine dependence, cigarettes, uncomplicated: Secondary | ICD-10-CM | POA: Diagnosis not present

## 2019-08-16 DIAGNOSIS — R1032 Left lower quadrant pain: Secondary | ICD-10-CM | POA: Insufficient documentation

## 2019-08-16 DIAGNOSIS — M545 Low back pain: Secondary | ICD-10-CM | POA: Diagnosis not present

## 2019-08-16 DIAGNOSIS — N451 Epididymitis: Secondary | ICD-10-CM

## 2019-08-16 DIAGNOSIS — R200 Anesthesia of skin: Secondary | ICD-10-CM | POA: Insufficient documentation

## 2019-08-16 DIAGNOSIS — N50812 Left testicular pain: Secondary | ICD-10-CM | POA: Diagnosis present

## 2019-08-16 DIAGNOSIS — R109 Unspecified abdominal pain: Secondary | ICD-10-CM

## 2019-08-16 LAB — URINALYSIS, ROUTINE W REFLEX MICROSCOPIC
Bilirubin Urine: NEGATIVE
Glucose, UA: NEGATIVE mg/dL
Hgb urine dipstick: NEGATIVE
Ketones, ur: NEGATIVE mg/dL
Leukocytes,Ua: NEGATIVE
Nitrite: NEGATIVE
Protein, ur: NEGATIVE mg/dL
Specific Gravity, Urine: 1.013 (ref 1.005–1.030)
pH: 5 (ref 5.0–8.0)

## 2019-08-16 LAB — COMPREHENSIVE METABOLIC PANEL
ALT: 24 U/L (ref 0–44)
AST: 25 U/L (ref 15–41)
Albumin: 3.8 g/dL (ref 3.5–5.0)
Alkaline Phosphatase: 62 U/L (ref 38–126)
Anion gap: 11 (ref 5–15)
BUN: 15 mg/dL (ref 6–20)
CO2: 22 mmol/L (ref 22–32)
Calcium: 9.3 mg/dL (ref 8.9–10.3)
Chloride: 104 mmol/L (ref 98–111)
Creatinine, Ser: 1.05 mg/dL (ref 0.61–1.24)
GFR calc Af Amer: >60 mL/min (ref >60)
GFR calc non Af Amer: >60 mL/min (ref >60)
Glucose, Bld: 137 mg/dL — ABNORMAL HIGH (ref 70–99)
Potassium: 3.1 mmol/L — ABNORMAL LOW (ref 3.5–5.1)
Sodium: 137 mmol/L (ref 135–145)
Total Bilirubin: 0.7 mg/dL (ref 0.3–1.2)
Total Protein: 7.2 g/dL (ref 6.5–8.1)

## 2019-08-16 LAB — CBC
HCT: 41.9 % (ref 39.0–52.0)
Hemoglobin: 14.7 g/dL (ref 13.0–17.0)
MCH: 34.9 pg — ABNORMAL HIGH (ref 26.0–34.0)
MCHC: 35.1 g/dL (ref 30.0–36.0)
MCV: 99.5 fL (ref 80.0–100.0)
Platelets: 253 10*3/uL (ref 150–400)
RBC: 4.21 MIL/uL — ABNORMAL LOW (ref 4.22–5.81)
RDW: 12.2 % (ref 11.5–15.5)
WBC: 11 10*3/uL — ABNORMAL HIGH (ref 4.0–10.5)
nRBC: 0.2 % (ref 0.0–0.2)

## 2019-08-16 LAB — HIV ANTIBODY (ROUTINE TESTING W REFLEX): HIV Screen 4th Generation wRfx: NONREACTIVE

## 2019-08-16 MED ORDER — SODIUM CHLORIDE 0.9 % IV BOLUS
1000.0000 mL | Freq: Once | INTRAVENOUS | Status: AC
  Administered 2019-08-16: 13:00:00 1000 mL via INTRAVENOUS

## 2019-08-16 MED ORDER — LIDOCAINE HCL (PF) 1 % IJ SOLN
INTRAMUSCULAR | Status: AC
  Administered 2019-08-16: 0.9 mL
  Filled 2019-08-16: qty 5

## 2019-08-16 MED ORDER — DOXYCYCLINE HYCLATE 100 MG PO CAPS: 100.0000 mg | ORAL_CAPSULE | Freq: Two times a day (BID) | ORAL | 0 refills | Status: DC

## 2019-08-16 MED ORDER — IOHEXOL 300 MG/ML  SOLN
100.0000 mL | Freq: Once | INTRAMUSCULAR | Status: AC | PRN
  Administered 2019-08-16: 100 mL via INTRAVENOUS

## 2019-08-16 MED ORDER — KETOROLAC TROMETHAMINE 30 MG/ML IJ SOLN
30.0000 mg | Freq: Once | INTRAMUSCULAR | Status: AC
  Administered 2019-08-16: 30 mg via INTRAVENOUS
  Filled 2019-08-16: qty 1

## 2019-08-16 MED ORDER — HYDROCODONE-ACETAMINOPHEN 5-325 MG PO TABS: 1.0000 | ORAL_TABLET | Freq: Four times a day (QID) | ORAL | 0 refills | Status: DC | PRN

## 2019-08-16 MED ORDER — OXYCODONE-ACETAMINOPHEN 5-325 MG PO TABS
1.0000 | ORAL_TABLET | ORAL | Status: DC | PRN
  Administered 2019-08-16: 1 via ORAL
  Filled 2019-08-16: qty 1

## 2019-08-16 MED ORDER — IBUPROFEN 800 MG PO TABS: 800.0000 mg | ORAL_TABLET | Freq: Three times a day (TID) | ORAL | 0 refills | Status: DC

## 2019-08-16 MED ORDER — HYDROMORPHONE HCL 1 MG/ML IJ SOLN
1.0000 mg | Freq: Once | INTRAMUSCULAR | Status: AC
  Administered 2019-08-16: 1 mg via INTRAVENOUS
  Filled 2019-08-16: qty 1

## 2019-08-16 MED ORDER — DOXYCYCLINE HYCLATE 100 MG PO TABS
100.0000 mg | ORAL_TABLET | Freq: Once | ORAL | Status: AC
  Administered 2019-08-16: 100 mg via ORAL
  Filled 2019-08-16: qty 1

## 2019-08-16 MED ORDER — CEFTRIAXONE SODIUM 250 MG IJ SOLR
250.0000 mg | Freq: Once | INTRAMUSCULAR | Status: AC
  Administered 2019-08-16: 250 mg via INTRAMUSCULAR
  Filled 2019-08-16: qty 250

## 2019-08-16 NOTE — ED Notes (Signed)
Patient transported to CT 

## 2019-08-16 NOTE — ED Triage Notes (Signed)
Pt reports left lower back pain for 3 weeks now, seen at [facility] and given abx but states he doesn't know what they were for. Pt now having numbness from his left testicle down to his foot that started on Saturday. Pt a.o, ambulatory

## 2019-08-16 NOTE — ED Notes (Signed)
Patient transported to Ultrasound 

## 2019-08-16 NOTE — ED Notes (Signed)
Patient Alert and oriented to baseline. Stable and ambulatory to baseline. Patient verbalized understanding of the discharge instructions.  Patient belongings were taken by the patient.   

## 2019-08-16 NOTE — ED Provider Notes (Signed)
[place] EMERGENCY DEPARTMENT Provider Note   CSN: [id number] Arrival date & time: 08/16/19  0208     History   Chief Complaint Chief Complaint  Patient presents with  . Numbness  . Back Pain    HPI Donald Brown is a 38 y.o. male.     HPI Patient reports about 3 weeks now has been having problems with some pretty bad pain in his testicle and left lower back.  Reports that he got seen at urgent care about 2 weeks ago and got diagnosed with an infection and given antibiotics.  He was not sure what antibiotic it was or what it was for.  Ports he took it for about 6 days and then missed about 2 days and then took about 2 more doses.  He reports he did not think he got better.  He reports over the past day now his left testicle is severely painful.  Any kind of movement is exquisitely painful.  He also feels like there is pain in his left flank and lower back and abdomen area as well as feeling like his left leg is numb.  He is not having any difficulty to walk but he feels like it is extremely only painful to the testicle.  Is not noted any penile drainage or discharge.  Past Medical History:  Diagnosis Date  . Migraines     There are no active problems to display for this patient.   History reviewed. No pertinent surgical history.      Home Medications    Prior to Admission medications   Medication Sig Start Date End Date Taking? Authorizing Provider  acetaminophen (TYLENOL) 500 MG tablet Take 1,000 mg by mouth every 6 (six) hours as needed for mild pain.   Yes [provider]  doxycycline (VIBRAMYCIN) 100 MG capsule Take 1 capsule (100 mg total) by mouth 2 (two) times daily. One po bid x 7 days 08/16/19   Charlesetta Shanks, MD  HYDROcodone-acetaminophen (NORCO/VICODIN) 5-325 MG tablet Take 1-2 tablets by mouth every 6 (six) hours as needed for moderate pain or severe pain. 08/16/19   Charlesetta Shanks, MD  ibuprofen (ADVIL) 800 MG tablet Take 1 tablet  (800 mg total) by mouth 3 (three) times daily. 08/16/19   Charlesetta Shanks, MD  levofloxacin (LEVAQUIN) 500 MG tablet Take 1 tablet (500 mg total) by mouth daily. 08/03/19   Vanessa Kick, MD    Family History Family History  Problem Relation Age of Onset  . Diabetes Mother   . Hypertension Mother     Social History Social History   Tobacco Use  . Smoking status: Current Every Day Smoker    Types: Cigarettes  . Smokeless tobacco: Never Used  Substance Use Topics  . Alcohol use: No  . Drug use: No     Allergies   Patient has no known allergies.   Review of Systems Review of Systems 10 Systems reviewed and are negative for acute change except as noted in the HPI.   Physical Exam Updated Vital Signs BP 125/85   Pulse (!) 54   Temp 99 F (37.2 C) (Oral)   Resp 18   SpO2 99%   Physical Exam Constitutional:      Comments: Patient is alert and appropriate.  Mental status clear.  Nontoxic.  He appears uncomfortable.  HENT:     Head: Normocephalic and atraumatic.  Eyes:     Conjunctiva/sclera: Conjunctivae normal.  Cardiovascular:     Rate and  Rhythm: Normal rate and regular rhythm.  Pulmonary:     Effort: Pulmonary effort is normal.     Breath sounds: Normal breath sounds.  Abdominal:     Comments: Abdomen is soft.  Patient does have moderate suprapubic discomfort and far left lateral abdominal backslash flank pain to palpation.  No guarding.  No palpable mass.  Penis is normal.  Scrotum is not swollen or enlarged.  Left testicle is exquisitely painful to palpation.  No mass or fullness within the inguinal canal.  No inguinal lymphadenopathy.  Musculoskeletal: Normal range of motion.        General: No swelling or tenderness.     Right lower leg: No edema.     Left lower leg: No edema.     Comments: Lateral lower extremities are normal in appearance.  They are symmetric.  Warm and dry.  Distal pulses are 2+.  Calves are soft and nontender.  No peripheral edema.   Patient has no difficulty with range of motion or strength testing of the left lower extremity.  Station normal bilaterally.  Neurological:     General: No focal deficit present.     Mental Status: He is oriented to person, place, and time.     Cranial Nerves: No cranial nerve deficit.     Coordination: Coordination normal.  Psychiatric:        Mood and Affect: Mood normal.      ED Treatments / Results  Labs (all labs ordered are listed, but only abnormal results are displayed) Labs Reviewed  COMPREHENSIVE METABOLIC PANEL - Abnormal; Notable for the following components:      Result Value   Potassium 3.1 (*)    Glucose, Bld 137 (*)    All other components within normal limits  CBC - Abnormal; Notable for the following components:   WBC 11.0 (*)    RBC 4.21 (*)    MCH 34.9 (*)    All other components within normal limits  URINALYSIS, ROUTINE W REFLEX MICROSCOPIC  HIV ANTIBODY (ROUTINE TESTING W REFLEX)  RPR  GC/CHLAMYDIA PROBE AMP ([place]    EKG None  Radiology Ct Abdomen Pelvis W Contrast  Result Date: 08/16/2019 CLINICAL DATA:  Acute left lower quadrant abdominal pain. EXAM: CT ABDOMEN AND PELVIS WITH CONTRAST TECHNIQUE: Multidetector CT imaging of the abdomen and pelvis was performed using the standard protocol following bolus administration of intravenous contrast. CONTRAST:  100mL OMNIPAQUE IOHEXOL 300 MG/ML  SOLN COMPARISON:  April 05, 2011. FINDINGS: Lower chest: No acute abnormality. Hepatobiliary: No focal liver abnormality is seen. No gallstones, gallbladder wall thickening, or biliary dilatation. Pancreas: Unremarkable. No pancreatic ductal dilatation or surrounding inflammatory changes. Spleen: Normal in size without focal abnormality. Adrenals/Urinary Tract: Adrenal glands are unremarkable. Kidneys are normal, without renal calculi, focal lesion, or hydronephrosis. Bladder is unremarkable. Stomach/Bowel: Stomach is within normal limits. Appendix  appears normal. No evidence of bowel wall thickening, distention, or inflammatory changes. Vascular/Lymphatic: No significant vascular findings are present. No enlarged abdominal or pelvic lymph nodes. Reproductive: Prostate is unremarkable. Other: No abdominal wall hernia or abnormality. No abdominopelvic ascites. Musculoskeletal: No acute or significant osseous findings. IMPRESSION: No abnormality seen in the abdomen or pelvis. Electronically Signed   By: Lupita RaiderJames  Green Jr M.D.   On: 08/16/2019 14:35   [place] Scrotum W/doppler  Result Date: 08/16/2019 CLINICAL DATA:  Left testicular pain for 4 weeks. EXAM: SCROTAL ULTRASOUND DOPPLER ULTRASOUND OF THE TESTICLES TECHNIQUE: Complete ultrasound examination of the testicles, epididymis,  and other scrotal structures was performed. Color and spectral Doppler ultrasound were also utilized to evaluate blood flow to the testicles. COMPARISON:  None. FINDINGS: Right testicle Measurements: 3.9 x 2.2 x 3.0 cm. No mass or microlithiasis visualized. Left testicle Measurements: 3.1 x 4.0 x 2.0 cm. No mass or microlithiasis visualized. Right epididymis:  Simple 0.3 cm cyst is noted. Left epididymis: Simple 0.2 cm cyst is noted. Increased signal on Doppler imaging in the left epididymal head and body relative to the right is seen. Hydrocele:  None visualized. Varicocele:  None visualized. Pulsed Doppler interrogation of both testes demonstrates normal low resistance arterial and venous waveforms bilaterally. IMPRESSION: Findings consistent with left epididymitis. Electronically Signed   By: Drusilla Kanner M.D.   On: 08/16/2019 11:32    Procedures Procedures (including critical care time)  Medications Ordered in ED Medications  oxyCODONE-acetaminophen (PERCOCET/ROXICET) 5-325 MG per tablet 1 tablet (1 tablet Oral Given 08/16/19 0222)  HYDROmorphone (DILAUDID) injection 1 mg (1 mg Intravenous Given 08/16/19 1156)  ketorolac (TORADOL) 30 MG/ML injection 30 mg (30 mg  Intravenous Given 08/16/19 1310)  cefTRIAXone (ROCEPHIN) injection 250 mg (250 mg Intramuscular Given 08/16/19 1312)  doxycycline (VIBRA-TABS) tablet 100 mg (100 mg Oral Given 08/16/19 1309)  sodium chloride 0.9 % bolus 1,000 mL (1,000 mLs Intravenous New Bag/Given 08/16/19 1308)  lidocaine (PF) (XYLOCAINE) 1 % injection (0.9 mLs  Given 08/16/19 1313)  iohexol (OMNIPAQUE) 300 MG/ML solution 100 mL (100 mLs Intravenous Contrast Given 08/16/19 1415)     Initial Impression / Assessment and Plan / ED Course  I have reviewed the triage vital signs and the nursing notes.  Pertinent labs & imaging results that were available during my care of the patient were reviewed by me and considered in my medical decision making (see chart for details).       Patient presents as outlined above.  Ultrasound ruled out torsion but consistent with epididymitis.  Patient does have exquisite testicular pain which is consistent with epididymitis.  Will treat with Rocephin and doxycycline.  Due to the concomitant symptoms of left flank pain and lower left abdominal pain CT abdomen also obtained.  No anomalies identified.  Kidney and bladder normal.  Patient was reporting numbness of the left leg.  That does not correlate with any symptoms.  His exam is normal.  Now that he is pain controlled he no longer endorses this symptom.  I have very low suspicion for a spinal etiology of this.  Patient is counseled on necessity to return immediately should he have recurrence of the symptom.  But he is fully functional at this time without symptoms of neurologic dysfunction.  Patient and his companion are counseled on the nature of it epididymitis with GC chlamydia being empirically treated in younger males.  He is counseled on avoiding sexual contact until fully treated and making other partners aware that he has been evaluated for this.  Final Clinical Impressions(s) / ED Diagnoses   Final diagnoses:  Epididymitis  Left flank  pain    ED Discharge Orders         Ordered    doxycycline (VIBRAMYCIN) 100 MG capsule  2 times daily     08/16/19 1503    ibuprofen (ADVIL) 800 MG tablet  3 times daily     08/16/19 1503    HYDROcodone-acetaminophen (NORCO/VICODIN) 5-325 MG tablet  Every 6 hours PRN     08/16/19 1503           Arby Barrette, MD  08/16/19 1510  

## 2019-08-17 LAB — RPR: RPR Ser Ql: NONREACTIVE

## 2019-08-17 LAB — GC/CHLAMYDIA PROBE AMP (~~LOC~~) NOT AT ARMC
Chlamydia: NEGATIVE
Neisseria Gonorrhea: NEGATIVE

## 2020-01-07 DIAGNOSIS — H40033 Anatomical narrow angle, bilateral: Secondary | ICD-10-CM | POA: Diagnosis not present

## 2020-01-07 DIAGNOSIS — H04123 Dry eye syndrome of bilateral lacrimal glands: Secondary | ICD-10-CM | POA: Diagnosis not present

## 2020-03-08 ENCOUNTER — Encounter (HOSPITAL_COMMUNITY): Payer: Self-pay | Admitting: Emergency Medicine

## 2020-03-08 ENCOUNTER — Ambulatory Visit (HOSPITAL_COMMUNITY)
Admission: EM | Admit: 2020-03-08 | Discharge: 2020-03-08 | Disposition: A | Discharge status: Home / Self Care | Payer: BC Managed Care – PPO | Attending: Internal Medicine | Admitting: Internal Medicine

## 2020-03-08 ENCOUNTER — Other Ambulatory Visit: Payer: Self-pay

## 2020-03-08 DIAGNOSIS — M79671 Pain in right foot: Secondary | ICD-10-CM | POA: Diagnosis not present

## 2020-03-08 DIAGNOSIS — M79672 Pain in left foot: Secondary | ICD-10-CM | POA: Diagnosis not present

## 2020-03-08 MED ORDER — IBUPROFEN 600 MG PO TABS: 600.0000 mg | ORAL_TABLET | Freq: Three times a day (TID) | ORAL | 0 refills | Status: AC | PRN

## 2020-03-08 MED ORDER — MICONAZOLE NITRATE 2 % EX POWD: CUTANEOUS | 0 refills | Status: AC | PRN

## 2020-03-08 NOTE — ED Provider Notes (Signed)
[place]    CSN: [id number] Arrival date & time: 03/08/20  1348      History   Chief Complaint Chief Complaint  Patient presents with  . Foot Pain    HPI Donald Brown is a 39 y.o. male comes to the urgent care for bilateral foot pain.  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  He denies any trauma to his feet.  He is able to bear weight on his feet.  Pain is in the distal part of the feet.   Patient is also concerned about fungal infection of his feet. HPI  Past Medical History:  Diagnosis Date  . Migraines     There are no problems to display for this patient.   History reviewed. No pertinent surgical history.     Home Medications    Prior to Admission medications   Medication Sig Start Date End Date Taking? Authorizing Provider  ibuprofen (ADVIL) 600 MG tablet Take 1 tablet (600 mg total) by mouth every 8 (eight) hours as needed for moderate pain. 03/08/20   Jaimon Bugaj, Myrene Galas, MD  miconazole (MICOTIN) 2 % powder Apply topically as needed for itching. 03/08/20   LampteyMyrene Galas, MD    Family History Family History  Problem Relation Age of Onset  . Diabetes Mother   . Hypertension Mother     Social History Social History   Tobacco Use  . Smoking status: Current Every Day Smoker    Types: Cigarettes  . Smokeless tobacco: Never Used  Substance Use Topics  . Alcohol use: No  . Drug use: No     Allergies   Patient has no known allergies.   Review of Systems Review of Systems  Constitutional: Negative.   Respiratory: Negative.   Gastrointestinal: Negative.   Genitourinary: Negative.   Musculoskeletal: Positive for arthralgias. Negative for gait problem.  Skin: Positive for rash. Negative for color change.  Neurological: Negative.      Physical Exam Triage Vital Signs ED Triage Vitals  Enc Vitals Group     BP 03/08/20 1422 118/72     Pulse Rate 03/08/20 1418 76     Resp 03/08/20 1418 16     Temp  03/08/20 1418 98.7 F (37.1 C)     Temp Source 03/08/20 1418 Oral     SpO2 03/08/20 1418 97 %     Weight --      Height --      Head Circumference --      Peak Flow --      Pain Score 03/08/20 1422 3     Pain Loc --      Pain Edu? --      Excl. in [place]? --    No data found.  Updated Vital Signs BP 118/72 (BP Location: Left Arm)   Pulse 76   Temp 98.7 F (37.1 C) (Oral)   Resp 16   SpO2 97%   Visual Acuity Right Eye Distance:   Left Eye Distance:   Bilateral Distance:    Right Eye Near:   Left Eye Near:    Bilateral Near:     Physical Exam Vitals and nursing note reviewed.  Constitutional:      General: He is not in acute distress.    Appearance: He is not ill-appearing.  Cardiovascular:     Rate and Rhythm: Normal rate and regular rhythm.     Pulses: Normal pulses.  Heart sounds: Normal heart sounds.  Pulmonary:     Effort: Pulmonary effort is normal.     Breath sounds: Normal breath sounds.  Abdominal:     General: Bowel sounds are normal.     Palpations: Abdomen is soft.  Musculoskeletal:        General: Tenderness present. No swelling, deformity or signs of injury. Normal range of motion.  Skin:    General: Skin is warm.     Capillary Refill: Capillary refill takes less than 2 seconds.  Neurological:     Mental Status: He is alert.      UC Treatments / Results  Labs (all labs ordered are listed, but only abnormal results are displayed) Labs Reviewed - No data to display  EKG   Radiology No results found.  Procedures Procedures (including critical care time)  Medications Ordered in UC Medications - No data to display  Initial Impression / Assessment and Plan / UC Course  I have reviewed the triage vital signs and the nursing notes.  Pertinent labs & imaging results that were available during my care of the patient were reviewed by me and considered in my medical decision making (see chart for details).     1.  Bilateral foot  pain: Advil 600 mg every 6 hours as needed for pain Miconazole powder twice daily Return to urgent care if symptoms worsen Patient is advised to get extra padding to his work shoes to help cushion his feet.  He is advised to also get half the size bigger than his current shoe size so there is no pressure on the toes. Final Clinical Impressions(s) / UC Diagnoses   Final diagnoses:  Foot pain, bilateral   Discharge Instructions   None    ED Prescriptions    Medication Sig Dispense Auth. Provider   ibuprofen (ADVIL) 600 MG tablet Take 1 tablet (600 mg total) by mouth every 8 (eight) hours as needed for moderate pain. 30 tablet Halo Shevlin, Britta Mccreedy, MD   miconazole (MICOTIN) 2 % powder Apply topically as needed for itching. 70 g Seraya Jobst, Britta Mccreedy, MD     PDMP not reviewed this encounter.   Merrilee Jansky, MD 03/08/20 1729

## 2020-03-08 NOTE — ED Triage Notes (Signed)
Pt c/o bilat foot pain x few months. Pt reports he thought it may be athletes foot but he has used various creams with no relief. Pt states this morning feet were painful and swollen and he was unable to bear weight. Pt states he has to wear steel toe boots to work.

## 2020-06-01 ENCOUNTER — Encounter: Payer: BC Managed Care – PPO | Admitting: Medical

## 2020-07-16 IMAGING — US US SCROTUM W/ DOPPLER COMPLETE
1 series · 14 of 25 positions shown · non-contrast
Comparison: None.

CLINICAL DATA: Left testicular pain for 4 weeks.

EXAM:
SCROTAL ULTRASOUND
DOPPLER ULTRASOUND OF THE TESTICLES
TECHNIQUE: Complete ultrasound examination of the testicles, epididymis, and
other scrotal structures was performed. Color and spectral Doppler
ultrasound were also utilized to evaluate blood flow to the
testicles.

[Series 1: us scrotum w/ doppler complete · 14 of 80 slices shown]
[im 1/80]
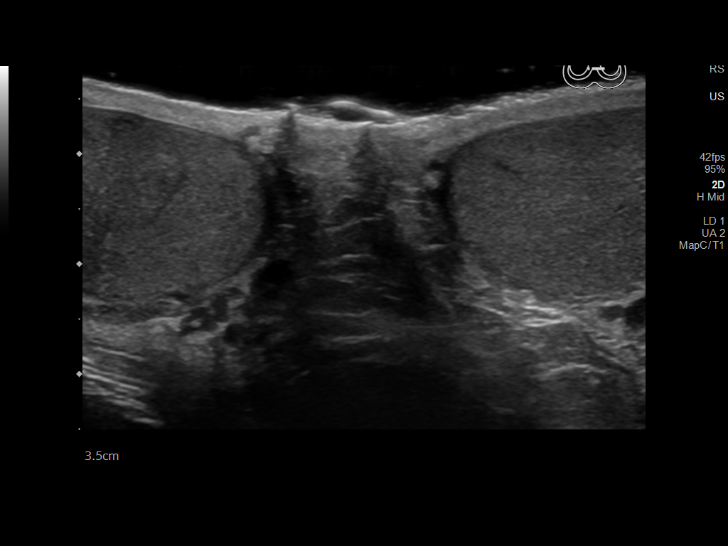
[im 7/80]
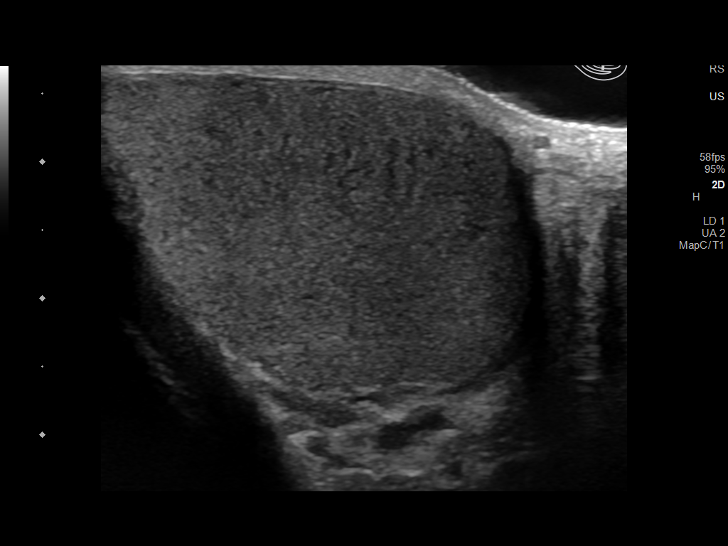
[im 14/80]
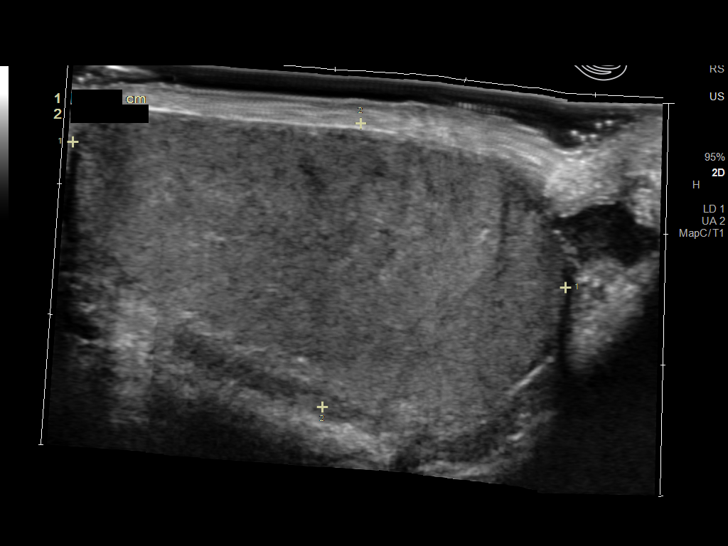
[im 20/80]
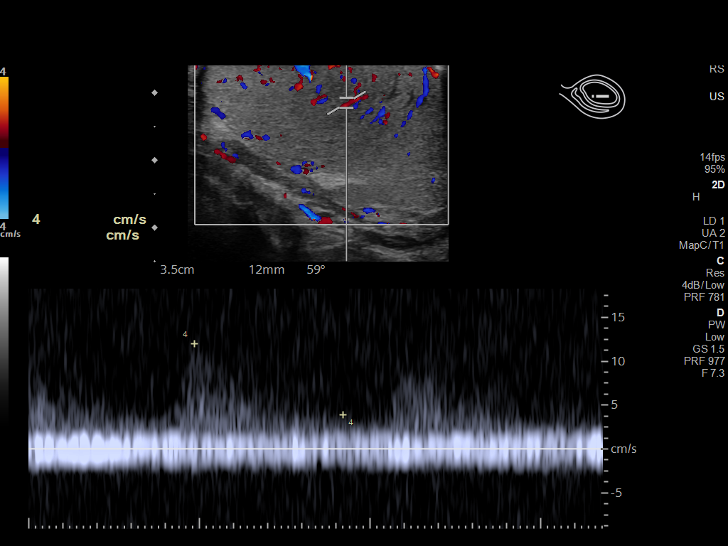
[im 27/80]
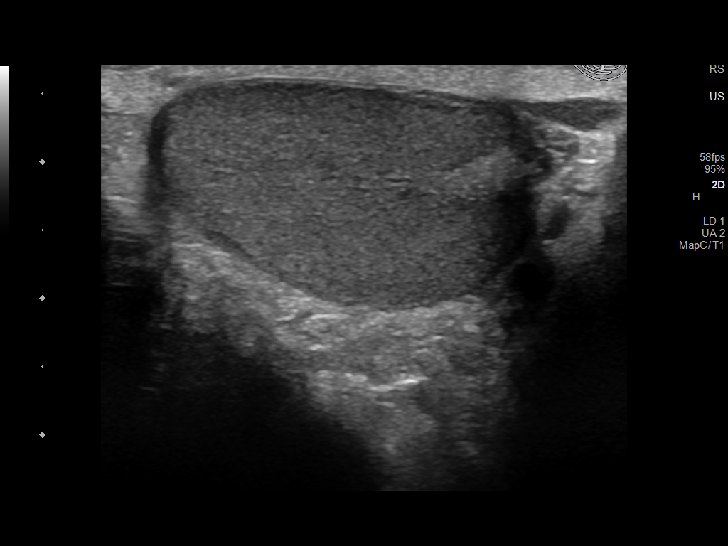
[im 30/80]
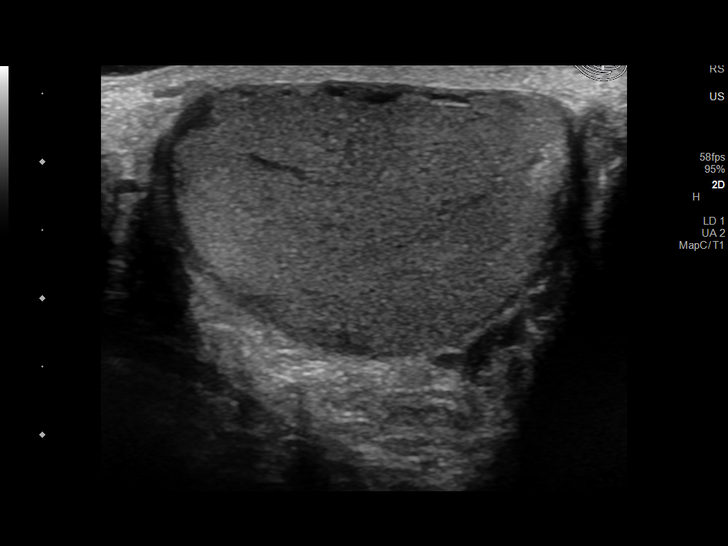
[im 37/80]
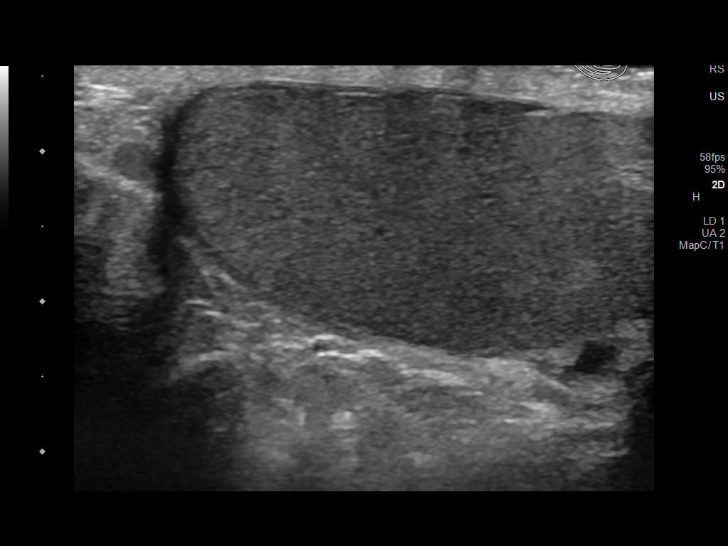
[im 43/80]
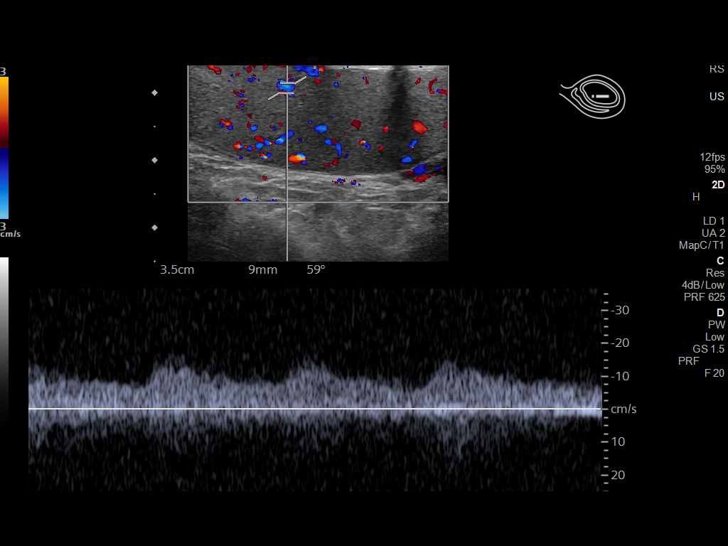
[im 50/80]
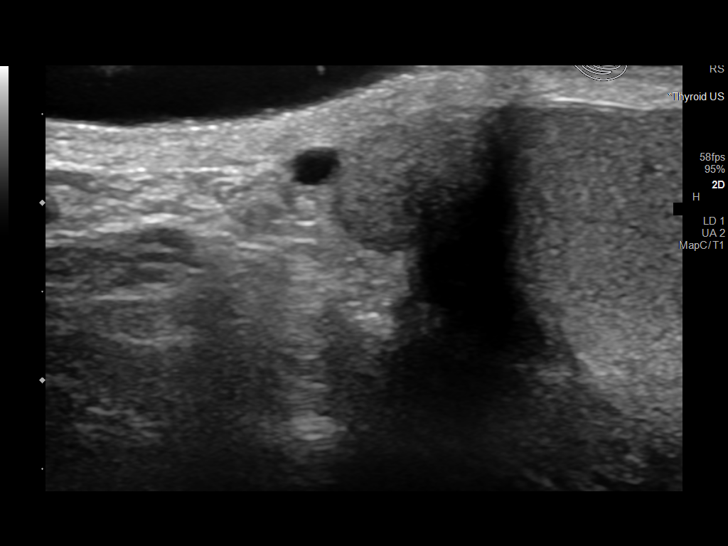
[im 53/80]
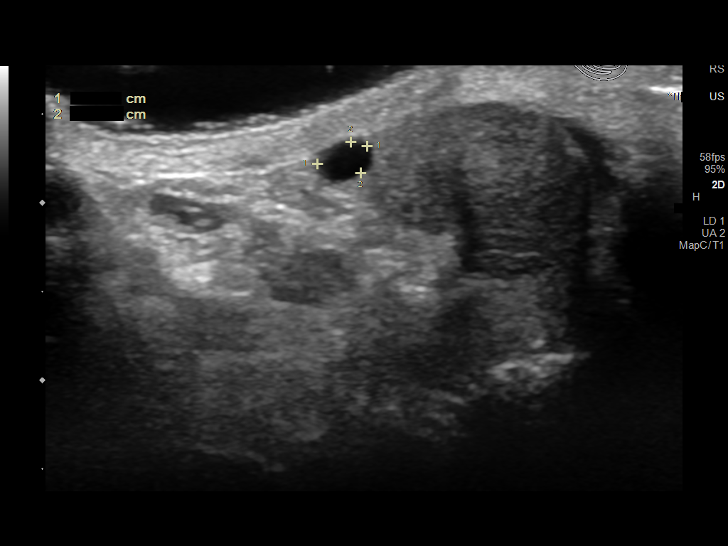
[im 60/80]
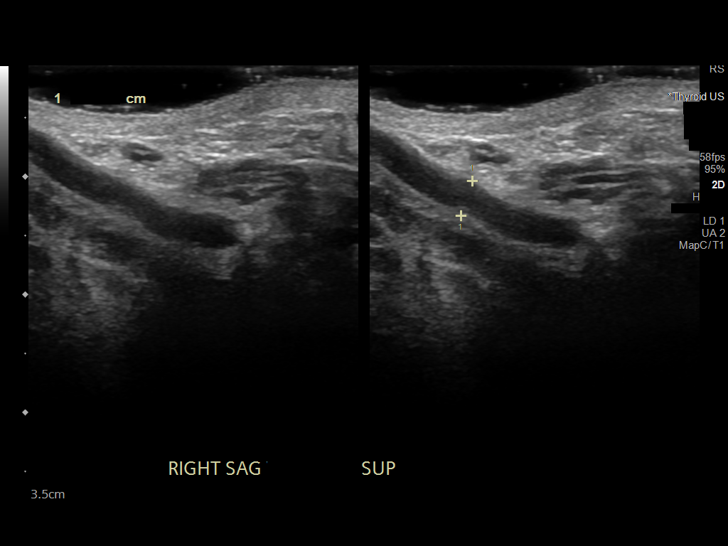
[im 66/80]
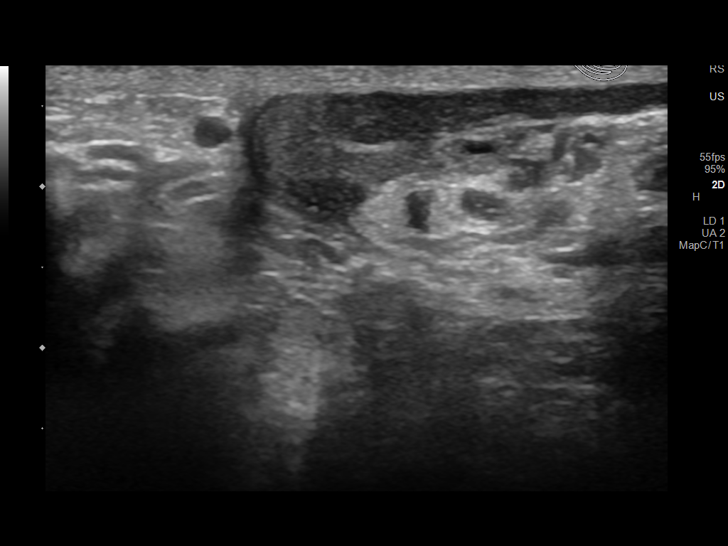
[im 73/80]
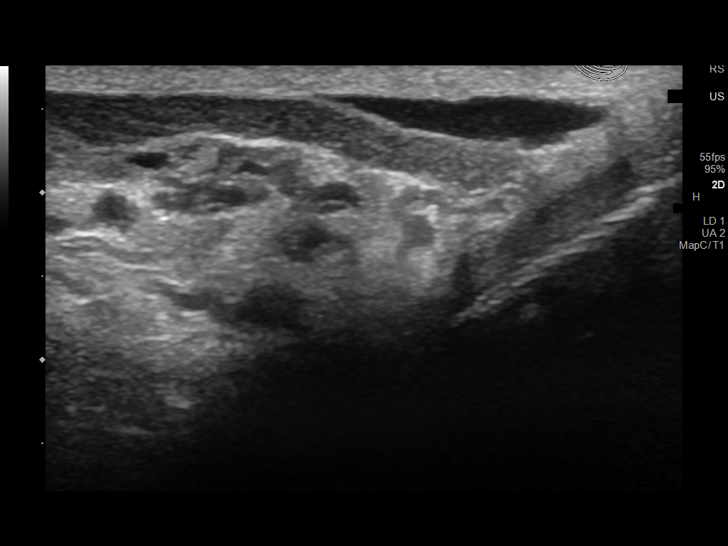
[im 80/80]
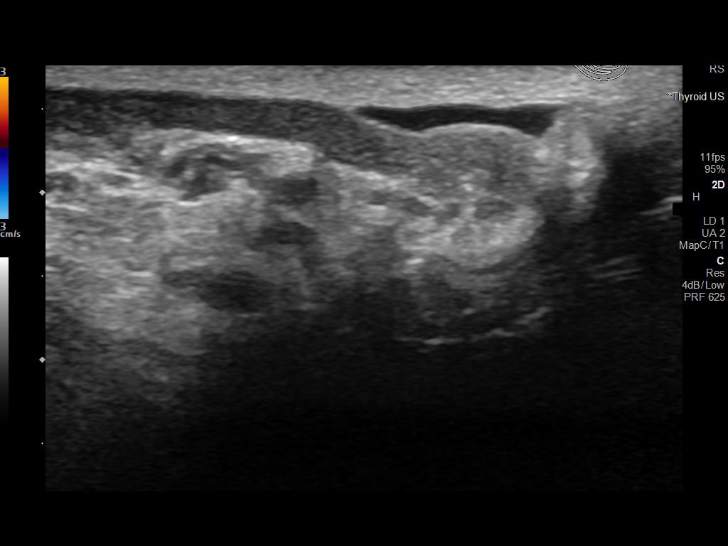

[14 of 25 positions shown; findings below may reference images not displayed]

FINDINGS: Right testicle

Measurements: 3.9 x 2.2 x 3.0 cm. No mass or microlithiasis
visualized.

Left testicle

Measurements: 3.1 x 4.0 x 2.0 cm. No mass or microlithiasis
visualized.

Right epididymis:  Simple 0.3 cm cyst is noted.

Left epididymis: Simple 0.2 cm cyst is noted. Increased signal on
Doppler imaging in the left epididymal head and body relative to the
right is seen.

Hydrocele:  None visualized.

Varicocele:  None visualized.

Pulsed Doppler interrogation of both testes demonstrates normal low
resistance arterial and venous waveforms bilaterally.
IMPRESSION: Findings consistent with left epididymitis.

## 2020-07-16 IMAGING — CT CT ABD-PELV W/ CM
2 of 4 series · 16 of 46 positions shown, 18 images · IV contrast (APPLIED)
Comparison: April 05, 2011.

CLINICAL DATA: Acute left lower quadrant abdominal pain.

EXAM:
CT ABDOMEN AND PELVIS WITH CONTRAST
TECHNIQUE: Multidetector CT imaging of the abdomen and pelvis was performed
using the standard protocol following bolus administration of
intravenous contrast.
CONTRAST:  100mL OMNIPAQUE IOHEXOL 300 MG/ML  SOLN

[Series 3: abd/ pelvis 5.0 i30f 2 · axial · 0.77mm/px · z∈[+760,+1185]mm · 13 of 93 slices shown, 15 images]
[im 4/93  soft-tissue]
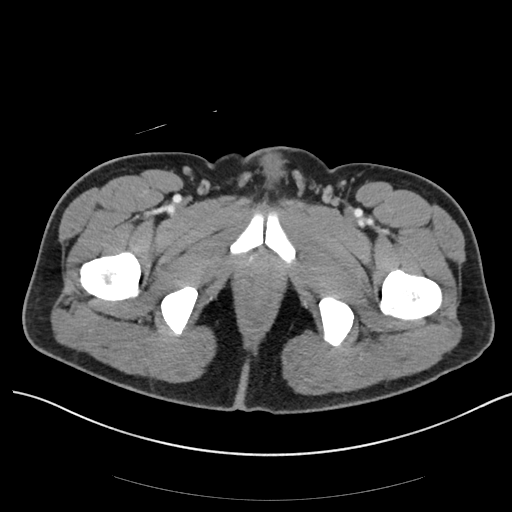
[im 4/93  bone]
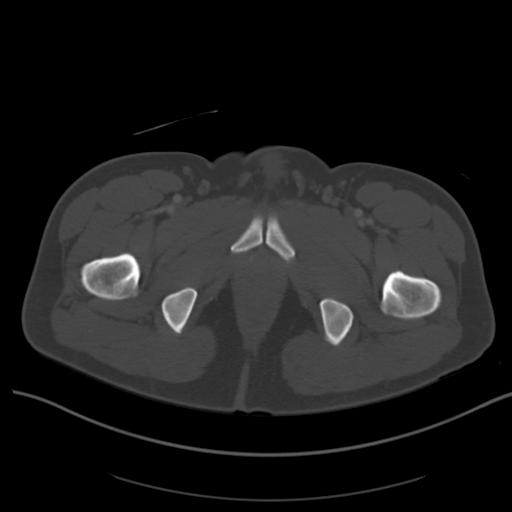
[im 12/93  soft-tissue]
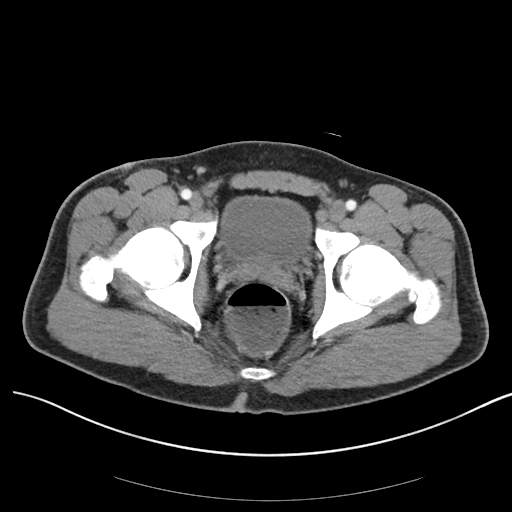
[im 20/93  soft-tissue]
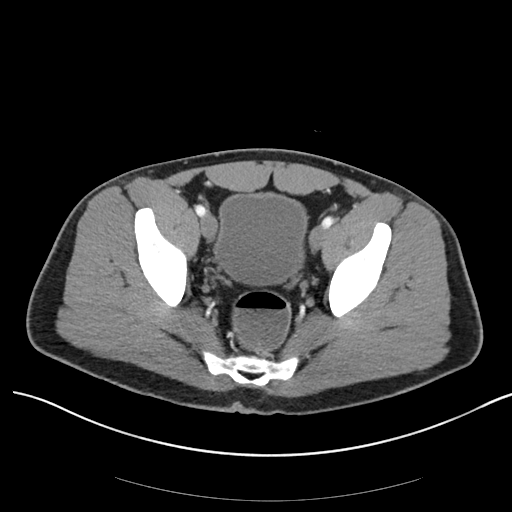
[im 27/93  soft-tissue]
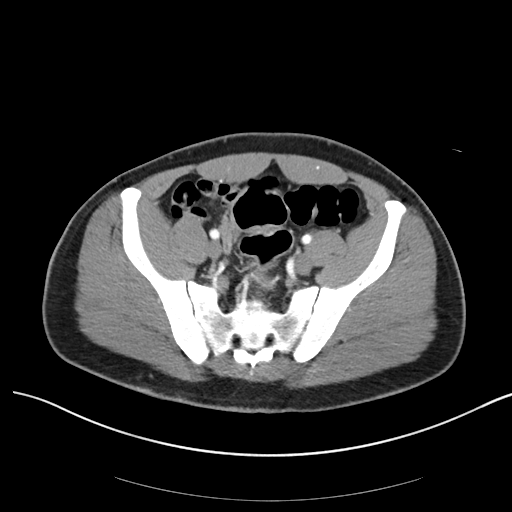
[im 31/93  soft-tissue]
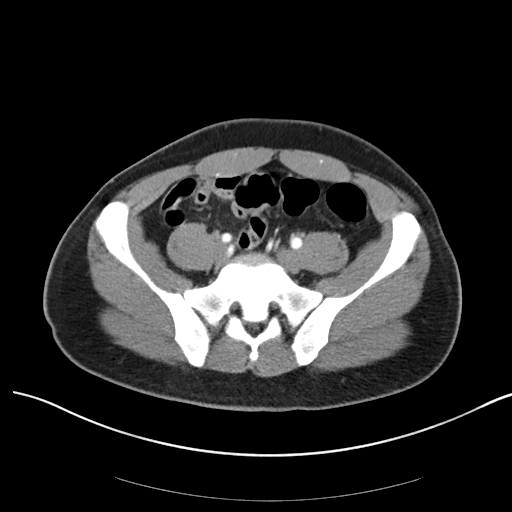
[im 39/93  soft-tissue]
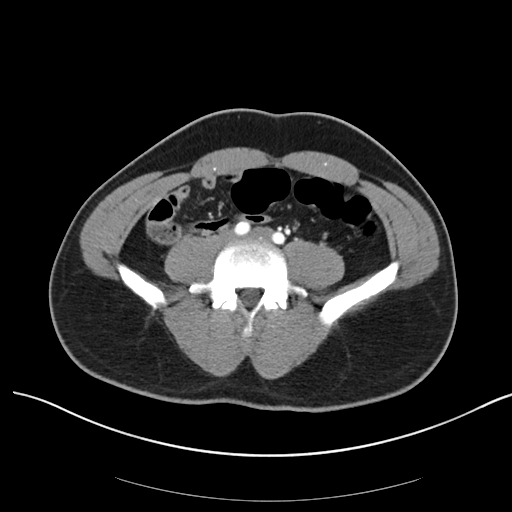
[im 47/93  soft-tissue]
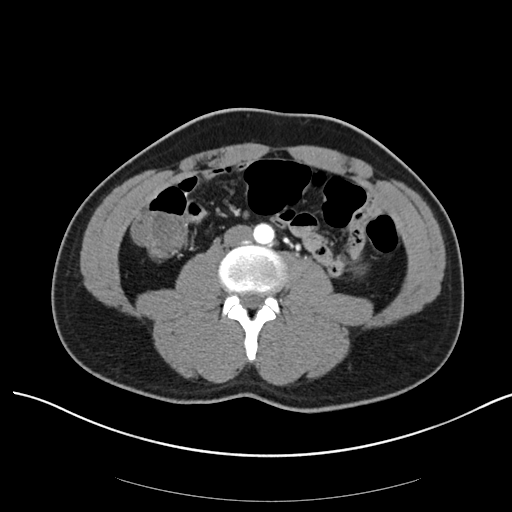
[im 54/93  soft-tissue]
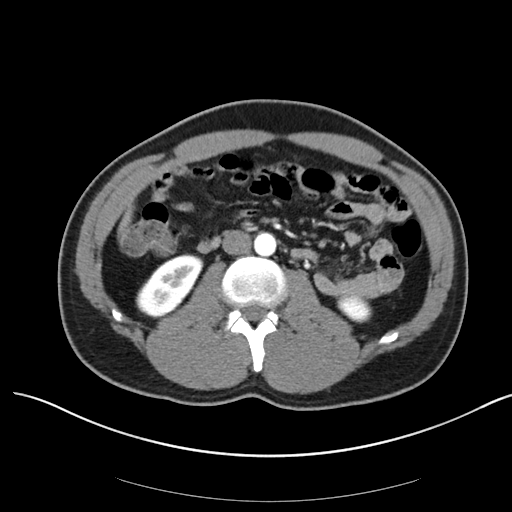
[im 62/93  soft-tissue]
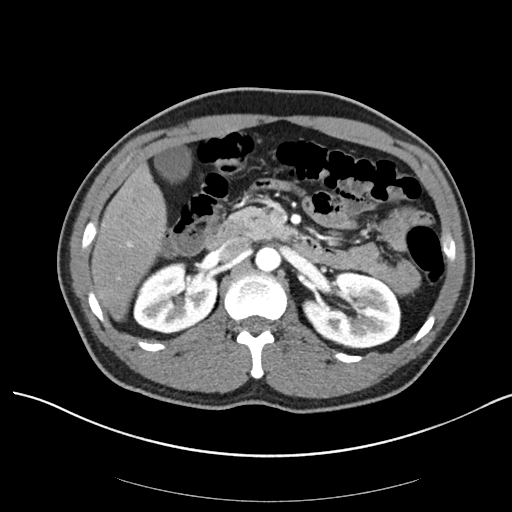
[im 62/93  bone]
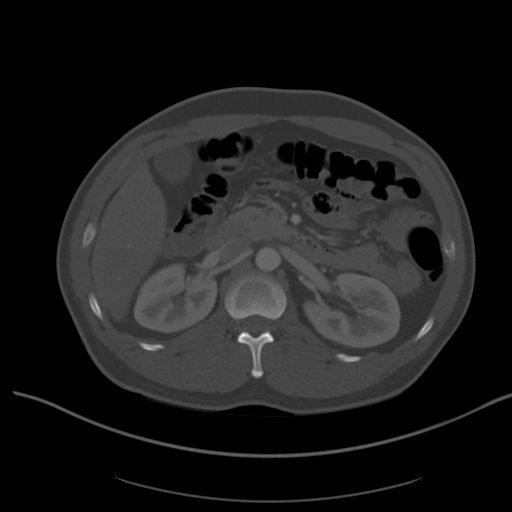
[im 66/93  soft-tissue]
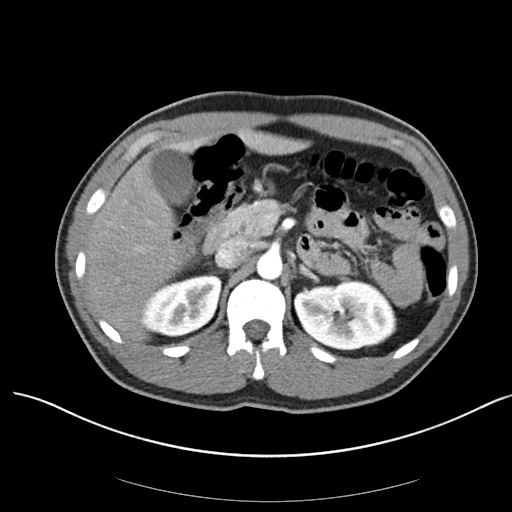
[im 73/93  soft-tissue]
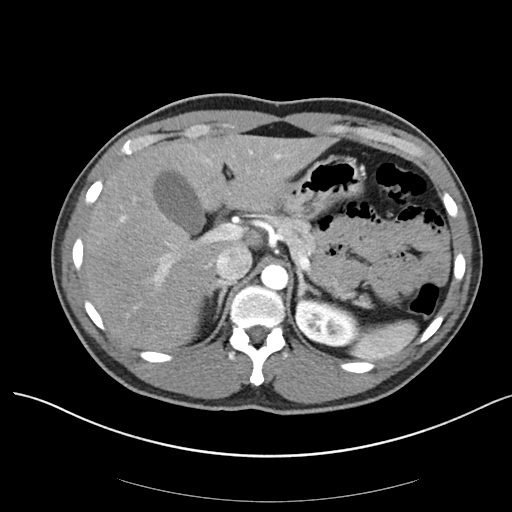
[im 81/93  soft-tissue]
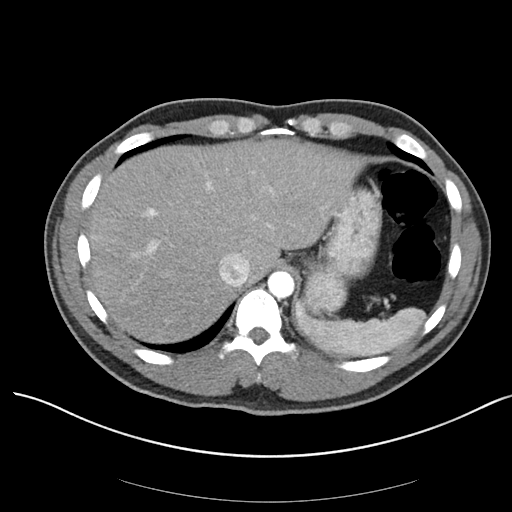
[im 89/93  soft-tissue]
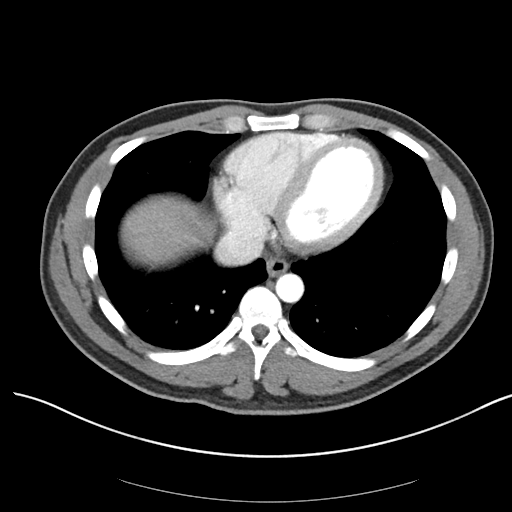

[Series 6: coronal soft tissue · coronal · 0.71mm/px · 3 of 86 slices shown]
[im 29/86  soft-tissue]
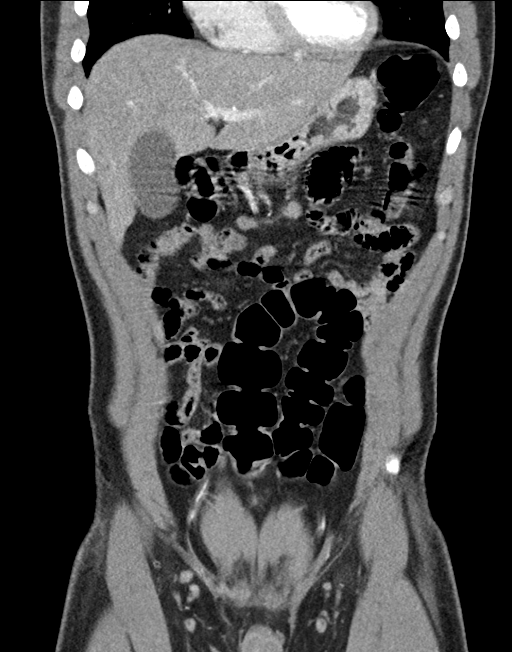
[im 38/86  soft-tissue]
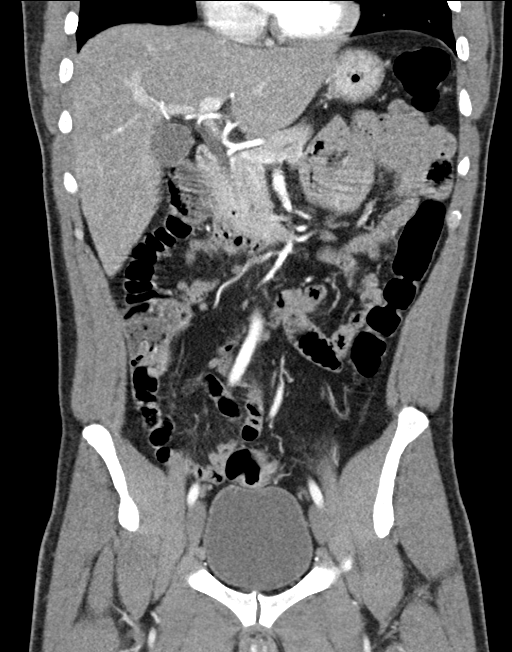
[im 48/86  soft-tissue]
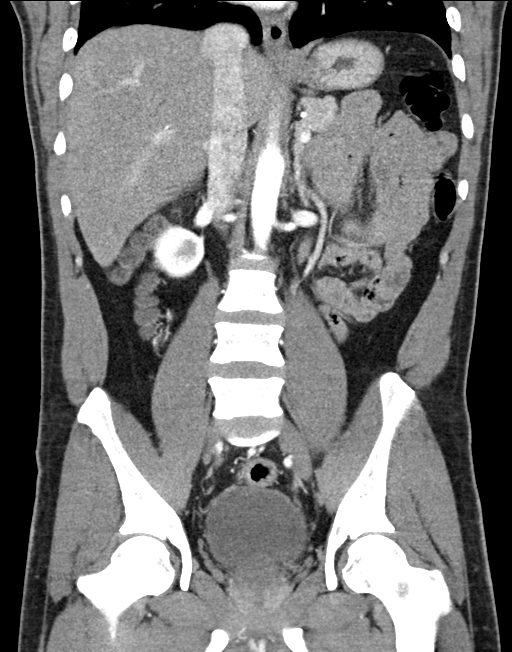

[16 of 46 positions shown; findings below may reference images not displayed]

FINDINGS: Lower chest: No acute abnormality.

Hepatobiliary: No focal liver abnormality is seen. No gallstones,
gallbladder wall thickening, or biliary dilatation.

Pancreas: Unremarkable. No pancreatic ductal dilatation or
surrounding inflammatory changes.

Spleen: Normal in size without focal abnormality.

Adrenals/Urinary Tract: Adrenal glands are unremarkable. Kidneys are
normal, without renal calculi, focal lesion, or hydronephrosis.
Bladder is unremarkable.

Stomach/Bowel: Stomach is within normal limits. Appendix appears
normal. No evidence of bowel wall thickening, distention, or
inflammatory changes.

Vascular/Lymphatic: No significant vascular findings are present. No
enlarged abdominal or pelvic lymph nodes.

Reproductive: Prostate is unremarkable.

Other: No abdominal wall hernia or abnormality. No abdominopelvic
ascites.

Musculoskeletal: No acute or significant osseous findings.
IMPRESSION: No abnormality seen in the abdomen or pelvis.
